# Patient Record
Sex: Male | Born: 1970
Health system: Southern US, Community
[De-identification: ages and names within clinical notes are randomized; demographics above are authoritative.]

## PROBLEM LIST (undated history)

## (undated) DIAGNOSIS — E1169 Type 2 diabetes mellitus with other specified complication: Secondary | ICD-10-CM

## (undated) DIAGNOSIS — J45909 Unspecified asthma, uncomplicated: Secondary | ICD-10-CM

## (undated) DIAGNOSIS — E119 Type 2 diabetes mellitus without complications: Secondary | ICD-10-CM

## (undated) DIAGNOSIS — K219 Gastro-esophageal reflux disease without esophagitis: Secondary | ICD-10-CM

## (undated) HISTORY — PX: APPENDECTOMY: SHX54

## (undated) HISTORY — DX: Type 2 diabetes mellitus with other specified complication: E11.69

## (undated) HISTORY — DX: Type 2 diabetes mellitus without complications: E11.9

## (undated) HISTORY — DX: Unspecified asthma, uncomplicated: J45.909

## (undated) HISTORY — PX: FRACTURE SURGERY: SHX138

---

## 2001-03-09 ENCOUNTER — Emergency Department (HOSPITAL_COMMUNITY): Admission: EM | Admit: 2001-03-09 | Discharge: 2001-03-09 | Payer: Self-pay | Admitting: Emergency Medicine

## 2006-03-23 ENCOUNTER — Encounter: Admission: RE | Admit: 2006-03-23 | Discharge: 2006-03-23 | Payer: Self-pay | Admitting: Occupational Medicine

## 2007-04-17 ENCOUNTER — Encounter: Admission: RE | Admit: 2007-04-17 | Discharge: 2007-04-17 | Payer: Self-pay | Admitting: Internal Medicine

## 2014-12-11 ENCOUNTER — Ambulatory Visit: Payer: Self-pay | Admitting: Allergy and Immunology

## 2014-12-15 ENCOUNTER — Ambulatory Visit (INDEPENDENT_AMBULATORY_CARE_PROVIDER_SITE_OTHER): Payer: PRIVATE HEALTH INSURANCE | Admitting: Allergy and Immunology

## 2014-12-15 ENCOUNTER — Encounter: Payer: Self-pay | Admitting: Allergy and Immunology

## 2014-12-15 VITALS — BP 132/98 | HR 68 | Temp 98.0°F | Resp 14 | Ht 68.39 in | Wt 220.0 lb

## 2014-12-15 DIAGNOSIS — J4530 Mild persistent asthma, uncomplicated: Secondary | ICD-10-CM

## 2014-12-15 DIAGNOSIS — K219 Gastro-esophageal reflux disease without esophagitis: Secondary | ICD-10-CM | POA: Diagnosis not present

## 2014-12-15 MED ORDER — ALBUTEROL SULFATE (2.5 MG/3ML) 0.083% IN NEBU
2.5000 mg | INHALATION_SOLUTION | Freq: Once | RESPIRATORY_TRACT | Status: AC
  Administered 2014-12-15: 2.5 mg via RESPIRATORY_TRACT

## 2014-12-15 MED ORDER — MOMETASONE FUROATE 220 MCG/INH IN AEPB: INHALATION_SPRAY | RESPIRATORY_TRACT | Status: DC

## 2014-12-15 MED ORDER — RANITIDINE HCL 300 MG PO TABS: ORAL_TABLET | ORAL | Status: DC

## 2014-12-15 NOTE — Patient Instructions (Addendum)
  1. Allergen avoidance measures  2. Treat reflux:   A. Continue ranitidine 354m one time per day  B. Slowly taper off all caffeine and chocolate  3. Change Advair to Asmanex 220 two inhalations one time per day.  4. If needed, Proair HFA 2 puffs every 4-6 hours  5. Get flu vaccine  6. Return in 4 weeks.

## 2014-12-15 NOTE — Progress Notes (Signed)
[facility]    NEW PATIENT NOTE  Referring provider: No ref. provider found PCP: Rochel Brome, MD    Subjective:   Patient ID: Gregory Gonzalez is a 44 y.o. male with a chief complaint of Breathing Problem  and the following problems:  HPI Comments:  Gregory Gonzalez is a 44 year old male who has a three-month history of breathing problems. He has developed shortness of breath as a persistent problem. Initially this was accompanied by chest pain and tachycardia requiring him to get a emergency room evaluation for pulmonary embolus which was negative. He subtotally saw Dr. Julianne Rice car and had a stress test and a stress echo and 24 hour or 48 hour Holter test all of which was negative. He did have some tachycardia up to 160. He was started on Cardizem. Somewhere prior to the administration of cardiac enzyme she developed wheezing and laryngitis. He subsequently ended up in the urgent care center again and was treated with Advair and ranitidine and a short acting bronchodilator and was better within 3 days. He does have reflux up into his mid chest and sometimes at nighttime up into his throat. He drinks 1 cup of coffee per day and one color per day.   History reviewed. No pertinent past medical history.  History reviewed. No pertinent past surgical history.  No outpatient prescriptions prior to visit.   No facility-administered medications prior to visit.    No orders of the defined types were placed in this encounter.    Allergies  Allergen Reactions  . Codeine Other (See Comments)    Hallucinations    Review of Systems  Constitutional: Negative for fever, chills, weight loss and malaise/fatigue.  HENT: Negative for congestion, ear discharge, ear pain, hearing loss, nosebleeds, sore throat and tinnitus.   Eyes: Negative for blurred vision, pain, discharge and redness.  Respiratory: Positive for cough, shortness of breath and wheezing.  Negative for hemoptysis, sputum production and stridor.   Cardiovascular: Positive for chest pain. Negative for palpitations and leg swelling.  Gastrointestinal: Negative for heartburn, nausea, vomiting, abdominal pain and diarrhea.  Musculoskeletal: Negative for myalgias and joint pain.  Skin: Negative for itching and rash.  Neurological: Negative for dizziness and headaches.    Family History  Problem Relation Age of Onset  . Asthma Mother   . Stroke Father   . Coronary artery disease Father   . Coronary artery disease Maternal Grandfather   . COPD Paternal Grandfather     Social History   Social History  . Marital Status: Married    Spouse Name: N/A  . Number of Children: N/A  . Years of Education: N/A   Occupational History  . Not on file.   Social History Main Topics  . Smoking status: Never Smoker   . Smokeless tobacco: Never Used  . Alcohol Use: Yes     Comment: rarely  . Drug Use: Not on file  . Sexual Activity: Not on file   Other Topics Concern  . Not on file   Social History Narrative  . No narrative on file    Environmental and Social history  Dene lives in a house with a dry environment, a cat and dog located inside the household, no carpeting in the bedroom, sleeping on a soft mattress without any plastic for the bed or pillows, and no smokers located inside the household. He works as a see an a Technical brewer.   Objective:   Filed Vitals:  12/15/14 1355  BP: 132/98  Pulse: 68  Temp: 98 F (36.7 C)  Resp: 14   Height: 5' 8.39" (173.7 cm) Weight: 220 lb 0.3 oz (99.8 kg)  Physical Exam  Constitutional: He is well-developed, well-nourished, and in no distress. No distress.  HENT:  Head: Normocephalic. Head is without laceration, without right periorbital erythema and without left periorbital erythema. Hair is normal.  Right Ear: Tympanic membrane and external ear normal. No lacerations. No drainage or tenderness. No foreign bodies. Tympanic membrane  is not injected, not scarred, not perforated, not erythematous, not retracted and not bulging. No middle ear effusion.  Left Ear: Tympanic membrane and external ear normal. No lacerations. No drainage or tenderness. No foreign bodies. Tympanic membrane is not injected, not scarred, not perforated, not erythematous, not retracted and not bulging.  No middle ear effusion.  Nose: Nose normal. No mucosal edema, rhinorrhea, nose lacerations or septal deviation.  No foreign bodies.  Mouth/Throat: Uvula is midline and oropharynx is clear and moist. No oral lesions. No uvula swelling. No oropharyngeal exudate, posterior oropharyngeal edema, posterior oropharyngeal erythema or tonsillar abscesses.  Eyes: Conjunctivae are normal. Pupils are equal, round, and reactive to light. Right eye exhibits no discharge. Left eye exhibits no discharge. No scleral icterus.  Neck: No tracheal deviation present. No thyromegaly present.  Cardiovascular: Normal rate, regular rhythm and normal heart sounds.  Exam reveals no gallop and no friction rub.   No murmur heard. Pulmonary/Chest: Effort normal and breath sounds normal. No stridor. No respiratory distress. He has no wheezes. He has no rales. He exhibits no tenderness.  Abdominal: Soft. He exhibits no distension and no mass. There is no tenderness. There is no rebound and no guarding.  Musculoskeletal: He exhibits no edema or tenderness.  Lymphadenopathy:    He has no cervical adenopathy.  Neurological: He is alert. Gait normal.  Skin: No rash noted. He is not diaphoretic. No erythema. No pallor.  Psychiatric: Mood and affect normal.    Diagnostics:  Allergy skin tests were performed. He did not demonstrate any hypersensitivity against a screening panel of aeroallergens or foods  Spirometry was performed and demonstrated an FEV1 of 3.4 to @ 93 % of predicted. After the administration of nebulized albuterol his FEV1 rose 3%  The patient had an Asthma Control Test  with the following results: ACT Total Score: 24.     Assessment and Plan:    1. Mild persistent asthma, uncomplicated   2. Gastroesophageal reflux disease, esophagitis presence not specified      1. Allergen avoidance measures  2. Treat reflux:   A. Continue ranitidine 393m one time per day  B. Slowly taper off all caffeine and chocolate  3. Change Advair to Asmanex 220 two inhalations one time per day.  4. If needed, Proair HFA 2 puffs every 4-6 hours  5. Get flu vaccine  6. Return in 4 weeks.  I will assume that BZaydonhas reflux as one of the triggers giving rise to his asthma. We'll treat him with a combination of caffeine taper and ranitidine and renewed his Advair for just a form of monotherapy using Asmanex. If he does well we will attempt to taper down his Asmanex. I will regroup him in 4 weeks.    EAllena Katz MD [facility]

## 2015-01-29 ENCOUNTER — Encounter: Payer: Self-pay | Admitting: Allergy and Immunology

## 2015-01-29 ENCOUNTER — Ambulatory Visit (INDEPENDENT_AMBULATORY_CARE_PROVIDER_SITE_OTHER): Payer: PRIVATE HEALTH INSURANCE | Admitting: Allergy and Immunology

## 2015-01-29 DIAGNOSIS — J4530 Mild persistent asthma, uncomplicated: Secondary | ICD-10-CM | POA: Diagnosis not present

## 2015-01-29 DIAGNOSIS — K219 Gastro-esophageal reflux disease without esophagitis: Secondary | ICD-10-CM | POA: Insufficient documentation

## 2015-01-29 MED ORDER — BUDESONIDE 180 MCG/ACT IN AEPB: INHALATION_SPRAY | RESPIRATORY_TRACT | Status: DC

## 2015-01-29 NOTE — Patient Instructions (Addendum)
   1. Treat reflux:   A. Continue ranitidine 355m one time per day  B. Continue to taper off all caffeine and chocolate  2. Change Asmanex 220 to Pumicort 180 two inhalations one time per day.  3. If needed, Proair HFA 2 puffs every 4-6 hours  4. Return in 12 weeks.  5. Cardizem?

## 2015-01-29 NOTE — Progress Notes (Signed)
[facility]  Follow-up Note  Refering Provider: Rochel Brome, MD Primary Provider: Rochel Brome, MD  Subjective:   Gregory Gonzalez is a 44 y.o. male who returns to the [facility] in re-evaluation of the following:  HPI Comments:  [name] returns to this clinic on a December 2016 in reevaluation of his asthma and reflux-induced respiratory disease. She is doing very well and has no complaints at this point in time. He's completely resolved all of his respiratory tract symptoms and his reflux symptoms in his throat issue. He continues on ranitidine every day but because of an insurance issue and he had to discontinue his Asmanex and he is noticed that he is developed a little bit more problems with chest fullness and chest tightness when he's missed his Asmanex for 3 days. He has not had any tachycardia as far as he knows.   Outpatient Encounter Prescriptions as of 01/29/2015  Medication Sig  . diltiazem (CARDIZEM CD) 120 MG 24 hr capsule Take 120 mg by mouth daily.  Marland Kitchen PROAIR HFA 108 (90 BASE) MCG/ACT inhaler INHALE ONE TO TWO PUFFS BY MOUTH EVERY 4 HOURS AS NEEDED  . ranitidine (ZANTAC) 300 MG tablet Take one tablet once daily.  . [DISCONTINUED] diltiazem (CARDIZEM CD) 120 MG 24 hr capsule TAKE ONE CAPSULE BY MOUTH DAILY  . mometasone (ASMANEX 60 METERED DOSES) 220 MCG/INH inhaler Take 2 inhalations once daily to prevent cough or wheeze.  Rinse, gargle,and spit after use. (Patient not taking: Reported on 01/29/2015)  . ranitidine (ZANTAC) 75 MG tablet Take 75 mg by mouth 2 (two) times daily. Take one tablet every morning. Can take one tablet at night if needed.  . traZODone (DESYREL) 50 MG tablet Take 50 mg by mouth at bedtime.  . [DISCONTINUED] ADVAIR DISKUS 250-50 MCG/DOSE AEPB Inhale 1 puff into the lungs 2 (two) times daily.    No facility-administered encounter medications on file as of 01/29/2015.    No orders of the  defined types were placed in this encounter.    No past medical history on file.  No past surgical history on file.  Allergies  Allergen Reactions  . Codeine Other (See Comments) and Anaphylaxis    Hallucinations    Review of Systems  Constitutional: Negative.   HENT: Negative.   Eyes: Negative.   Respiratory: Negative.   Cardiovascular: Negative.   Gastrointestinal: Negative.   Musculoskeletal: Negative.   Skin: Negative.   Hematological: Negative.      Objective:   There were no vitals filed for this visit.        Physical Exam  Constitutional: He appears well-developed and well-nourished. No distress.  HENT:  Head: Normocephalic and atraumatic. Head is without right periorbital erythema and without left periorbital erythema.  Right Ear: Tympanic membrane, external ear and ear canal normal. No drainage or tenderness. No foreign bodies. Tympanic membrane is not injected, not scarred, not perforated, not erythematous, not retracted and not bulging. No middle ear effusion.  Left Ear: Tympanic membrane, external ear and ear canal normal. No drainage or tenderness. No foreign bodies. Tympanic membrane is not injected, not scarred, not perforated, not erythematous, not retracted and not bulging.  No middle ear effusion.  Nose: Nose normal. No mucosal edema, rhinorrhea, nose lacerations or sinus tenderness.  No foreign bodies.  Mouth/Throat: Oropharynx is clear and moist. No oropharyngeal exudate, posterior oropharyngeal edema, posterior oropharyngeal erythema or tonsillar abscesses.  Eyes: Lids are normal. Right  eye exhibits no chemosis, no discharge and no exudate. No foreign body present in the right eye. Left eye exhibits no chemosis, no discharge and no exudate. No foreign body present in the left eye. Right conjunctiva is not injected. Left conjunctiva is not injected.  Neck: Neck supple. No tracheal tenderness present. No tracheal deviation and no edema present. No thyroid  mass and no thyromegaly present.  Cardiovascular: Normal rate, regular rhythm, S1 normal and S2 normal.  Exam reveals no gallop.   No murmur heard. Pulmonary/Chest: No accessory muscle usage or stridor. No respiratory distress. He has no wheezes. He has no rhonchi. He has no rales.  Abdominal: Soft.  Lymphadenopathy:       Head (right side): No tonsillar adenopathy present.       Head (left side): No tonsillar adenopathy present.    He has no cervical adenopathy.  Neurological: He is alert.  Skin: No rash noted. He is not diaphoretic.  Psychiatric: He has a normal mood and affect. His behavior is normal.    Diagnostics:    Spirometry was performed and demonstrated an FEV1 of 3.47 at 83 % of predicted.  The patient had an Asthma Control Test with the following results:  .    Assessment and Plan:   1. Mild persistent asthma, uncomplicated   2. Gastroesophageal reflux disease, esophagitis presence not specified       1. Treat reflux:   A. Continue ranitidine 365m one time per day  B. Continue to taper off all caffeine and chocolate  2. Change Asmanex 220 to Pumicort 180 two inhalations one time per day.  3. If needed, Proair HFA 2 puffs every 4-6 hours  4. Return in 12 weeks.  5. Cardizem?   BArifhas done very well on his current medical therapy and we'll continue to have him use an inhaled steroid and treatment for reflux and have him return to this clinic possibly 12 weeks. She's questioning whether or not he needs Cartia zyme. Apparently he had a very thorough evaluation him a cardiac standpoint looking for any pathology which apparently was negative regarding his tachycardia. He is wondering if his problem all along was a condition with asthma and possibly reflux. I did inform him that he can stop his Cardizem and see what happens and if he redevelops tachycardia can always repeat start Cardizem.   EAllena Katz MD [facility]

## 2015-09-22 MED FILL — PANTOPRAZOLE SOD DR 40 MG T: 40 | 30 days supply | Qty: 30 | Fill #0

## 2015-10-08 DIAGNOSIS — Z0000 Encounter for general adult medical examination without abnormal findings: Secondary | ICD-10-CM | POA: Diagnosis not present

## 2015-10-08 DIAGNOSIS — Z6832 Body mass index (BMI) 32.0-32.9, adult: Secondary | ICD-10-CM | POA: Diagnosis not present

## 2015-10-08 DIAGNOSIS — R7301 Impaired fasting glucose: Secondary | ICD-10-CM | POA: Diagnosis not present

## 2015-10-08 MED FILL — raNITIdine HCL 300 MG TABS: 300 | 90 days supply | Qty: 90 | Fill #0

## 2015-10-08 MED FILL — traZODone HCL 50 MG TABS: 50 | 90 days supply | Qty: 90 | Fill #0

## 2015-10-19 MED FILL — PANTOPRAZOLE SOD DR 40 MG T: 40 | 30 days supply | Qty: 30 | Fill #1

## 2015-11-20 MED FILL — PANTOPRAZOLE SOD DR 40 MG T: 40 | 30 days supply | Qty: 30 | Fill #2

## 2015-12-25 MED FILL — PANTOPRAZOLE SOD DR 40 MG T: 40 | 30 days supply | Qty: 30 | Fill #3

## 2016-01-18 MED FILL — raNITIdine HCL 300 MG TABS: 300 | 90 days supply | Qty: 90 | Fill #1

## 2016-01-18 MED FILL — traZODone HCL 50 MG TABS: 50 | 90 days supply | Qty: 90 | Fill #1

## 2016-01-18 MED FILL — PANTOPRAZOLE SOD DR 40 MG T: 40 | 90 days supply | Qty: 90 | Fill #0

## 2016-01-22 MED FILL — FLUTICASONE PROP 0.05% CRM: 0.05 | 7 days supply | Qty: 30 | Fill #0

## 2016-04-18 MED FILL — traZODone HCL 50 MG TABS: 50 | 90 days supply | Qty: 90 | Fill #0

## 2016-04-18 MED FILL — raNITIdine HCL 300 MG TABS: 300 | 90 days supply | Qty: 90 | Fill #0

## 2016-04-18 MED FILL — PANTOPRAZOLE SOD DR 40 MG T: 40 | 90 days supply | Qty: 90 | Fill #1

## 2016-04-21 DIAGNOSIS — R799 Abnormal finding of blood chemistry, unspecified: Secondary | ICD-10-CM | POA: Diagnosis not present

## 2016-04-21 DIAGNOSIS — F5101 Primary insomnia: Secondary | ICD-10-CM | POA: Diagnosis not present

## 2016-04-21 DIAGNOSIS — J4520 Mild intermittent asthma, uncomplicated: Secondary | ICD-10-CM | POA: Diagnosis not present

## 2016-04-21 DIAGNOSIS — K219 Gastro-esophageal reflux disease without esophagitis: Secondary | ICD-10-CM | POA: Diagnosis not present

## 2016-06-08 MED FILL — SYMBICORT 160-4.5 MCG INH: 160-4.5 | 30 days supply | Qty: 10 | Fill #0

## 2016-07-11 MED FILL — BYSTOLIC 5 MG TABLET: 5 | 90 days supply | Qty: 90 | Fill #0

## 2016-07-19 MED FILL — raNITIdine HCL 300 MG TABS: 300 | 90 days supply | Qty: 90 | Fill #0

## 2016-07-19 MED FILL — traZODone HCL 50 MG TABS: 50 | 90 days supply | Qty: 90 | Fill #0

## 2016-07-19 MED FILL — PANTOPRAZOLE SOD DR 40 MG T: 40 | 90 days supply | Qty: 90 | Fill #0

## 2016-08-03 MED FILL — SYMBICORT 160-4.5 MCG INH: 160-4.5 | 30 days supply | Qty: 10 | Fill #1

## 2016-10-07 MED FILL — PANTOPRAZOLE SOD DR 40 MG T: 40 | 90 days supply | Qty: 90 | Fill #1

## 2016-10-07 MED FILL — raNITIdine HCL 300 MG TABS: 300 | 90 days supply | Qty: 90 | Fill #1

## 2016-10-07 MED FILL — BYSTOLIC 5 MG TABLET: 5 | 90 days supply | Qty: 90 | Fill #1

## 2017-05-29 MED FILL — BYSTOLIC 5 MG TABLET: 5 | 90 days supply | Qty: 90 | Fill #2

## 2017-10-02 MED FILL — BYSTOLIC 5 MG TABLET: 5 | 90 days supply | Qty: 90 | Fill #0

## 2018-02-02 MED FILL — BYSTOLIC 5 MG TABLET: 5 | 90 days supply | Qty: 90 | Fill #1

## 2018-05-18 MED FILL — BYSTOLIC 5 MG TABLET: 5 | 90 days supply | Qty: 90 | Fill #0

## 2018-06-29 DIAGNOSIS — Z03818 Encounter for observation for suspected exposure to other biological agents ruled out: Secondary | ICD-10-CM | POA: Diagnosis not present

## 2018-07-06 ENCOUNTER — Ambulatory Visit (INDEPENDENT_AMBULATORY_CARE_PROVIDER_SITE_OTHER): Payer: 59 | Admitting: Family Medicine

## 2018-07-06 ENCOUNTER — Encounter: Payer: Self-pay | Admitting: Family Medicine

## 2018-07-06 ENCOUNTER — Other Ambulatory Visit: Payer: Self-pay

## 2018-07-06 VITALS — HR 55 | Ht 70.0 in | Wt 216.0 lb

## 2018-07-06 DIAGNOSIS — G4700 Insomnia, unspecified: Secondary | ICD-10-CM | POA: Insufficient documentation

## 2018-07-06 DIAGNOSIS — K219 Gastro-esophageal reflux disease without esophagitis: Secondary | ICD-10-CM

## 2018-07-06 DIAGNOSIS — T7840XA Allergy, unspecified, initial encounter: Secondary | ICD-10-CM | POA: Insufficient documentation

## 2018-07-06 DIAGNOSIS — R000 Tachycardia, unspecified: Secondary | ICD-10-CM | POA: Insufficient documentation

## 2018-07-06 MED ORDER — PANTOPRAZOLE SODIUM 40 MG PO TBEC: 40.0000 mg | DELAYED_RELEASE_TABLET | Freq: Every day | ORAL | 3 refills | Status: DC

## 2018-07-06 MED ORDER — LORATADINE 10 MG PO TABS: 10.0000 mg | ORAL_TABLET | Freq: Every day | ORAL | 3 refills | Status: DC

## 2018-07-06 MED ORDER — NEBIVOLOL HCL 5 MG PO TABS: 5.0000 mg | ORAL_TABLET | Freq: Every day | ORAL | 3 refills | Status: DC

## 2018-07-06 MED ORDER — TRAZODONE HCL 50 MG PO TABS: 50.0000 mg | ORAL_TABLET | Freq: Every day | ORAL | 3 refills | Status: DC

## 2018-07-06 MED FILL — PANTOPRAZOLE SOD DR 40 MG T: 40 | 90 days supply | Qty: 90 | Fill #0

## 2018-07-06 MED FILL — traZODone HCL 50 MG TABS: 50 | 90 days supply | Qty: 90 | Fill #0

## 2018-07-06 MED FILL — LORATADINE 10 MG TABLET: 10 | 100 days supply | Qty: 100 | Fill #0

## 2018-07-06 NOTE — Progress Notes (Signed)
Chief Complaint  Patient presents with  . New Patient (Initial Visit)    Establish care        New Patient Visit SUBJECTIVE: HPI: Gregory Gonzalez is an 48 y.o.male who is being seen for establishing care.  Due to COVID-19 pandemic, we are interacting via web portal for an electronic face-to-face visit. I verified patient's ID using 2 identifiers. Patient agreed to proceed with visit via this method. Patient is at home, I am at office. Patient and I are present for visit.   Hx of reflux that does poorly when he passes a certain weight. He is passed that weight currently due to pandemic. Would like refill of Protonix with goal of coming off once he loses the weight.  Trazodone helps with sleep. No AE's, compliant.   Bystolic for heart rate. Has undergone extensive cardiac workup for this. Nothing sinister was found.  Claritin helps with his seasonal allergies.   Allergies  Allergen Reactions  . Codeine Other (See Comments) and Anaphylaxis    Hallucinations    Past Medical History:  Diagnosis Date  . Asthma    History reviewed. No pertinent surgical history.   Family History  Problem Relation Age of Onset  . Stroke Father   . Coronary artery disease Father   . Asthma Mother   . Coronary artery disease Maternal Grandfather   . COPD Paternal Grandfather    Allergies  Allergen Reactions  . Codeine Other (See Comments) and Anaphylaxis    Hallucinations    Current Outpatient Medications:  .  loratadine (CLARITIN) 10 MG tablet, Take 1 tablet (10 mg total) by mouth daily., Disp: 90 tablet, Rfl: 3 .  nebivolol (BYSTOLIC) 5 MG tablet, Take 1 tablet (5 mg total) by mouth daily., Disp: 90 tablet, Rfl: 3 .  pantoprazole (PROTONIX) 40 MG tablet, Take 1 tablet (40 mg total) by mouth daily., Disp: 90 tablet, Rfl: 3 .  traZODone (DESYREL) 50 MG tablet, Take 1 tablet (50 mg total) by mouth at bedtime., Disp: 90 tablet, Rfl: 3   ROS Cardiovascular: Denies chest pain  Respiratory:  Denies dyspnea   OBJECTIVE: No conversational dyspnea Age appropriate judgment and insight Nml affect and mood  ASSESSMENT/PLAN: Gastroesophageal reflux disease, esophagitis presence not specified - Plan: pantoprazole (PROTONIX) 40 MG tablet  Allergic state, initial encounter - Plan: loratadine (CLARITIN) 10 MG tablet  Insomnia, unspecified type - Plan: traZODone (DESYREL) 50 MG tablet  Tachycardia - Plan: nebivolol (BYSTOLIC) 5 MG tablet  Stable on above. Refilled. Counseled on diet and exercise. Patient should return at earliest convenience for CPE. The patient voiced understanding and agreement to the plan.   [place], DO 07/06/18  10:43 AM

## 2018-08-28 ENCOUNTER — Encounter: Payer: 59 | Admitting: Family Medicine

## 2018-08-29 MED FILL — BYSTOLIC 5 MG TABLET: 5 | 90 days supply | Qty: 90 | Fill #0

## 2018-08-31 ENCOUNTER — Encounter: Payer: Self-pay | Admitting: Family Medicine

## 2018-10-02 MED FILL — traZODone HCL 50 MG TABS: 50 | 90 days supply | Qty: 90 | Fill #1

## 2018-10-02 MED FILL — PANTOPRAZOLE SOD DR 40 MG T: 40 | 90 days supply | Qty: 90 | Fill #1

## 2018-10-02 MED FILL — LORATADINE 10 MG TABLET: 10 | 100 days supply | Qty: 100 | Fill #1

## 2018-11-30 MED FILL — BYSTOLIC 5 MG TABLET: 5 | 90 days supply | Qty: 90 | Fill #0

## 2019-04-12 ENCOUNTER — Telehealth: Payer: Self-pay

## 2019-04-12 ENCOUNTER — Encounter: Payer: Self-pay | Admitting: Family Medicine

## 2019-04-12 MED ORDER — ATENOLOL 50 MG PO TABS: 50.0000 mg | ORAL_TABLET | Freq: Every day | ORAL | 3 refills | Status: DC

## 2019-04-12 MED FILL — ATENOLOL 50 MG TABLET: 50 | 30 days supply | Qty: 30 | Fill #0

## 2019-04-12 NOTE — Telephone Encounter (Signed)
Plz let pt know and see if he has failed any of these before. Ty.

## 2019-04-12 NOTE — Telephone Encounter (Signed)
Called the patient and he has not been on any of them The only thing he has tried was the cardizem

## 2019-04-12 NOTE — Telephone Encounter (Signed)
PA denied. Pt must have failed all of the following:  Atenolol Bisoprolol Metoprolol

## 2019-04-12 NOTE — Telephone Encounter (Signed)
Called informed of PCP instructions.

## 2019-04-12 NOTE — Telephone Encounter (Signed)
If for the heart rate, would rec atenolol, will send in. Ty.

## 2019-04-12 NOTE — Addendum Note (Signed)
Addended by: Sharon Seller B on: 04/12/2019 01:01 PM   Modules accepted: Orders

## 2019-04-12 NOTE — Telephone Encounter (Signed)
PA initiated via Covermymeds; KEY: [id number]. Awaiting determination.

## 2020-01-07 ENCOUNTER — Other Ambulatory Visit: Payer: Self-pay

## 2020-01-07 ENCOUNTER — Other Ambulatory Visit: Payer: Self-pay | Admitting: Family Medicine

## 2020-01-07 ENCOUNTER — Encounter: Payer: Self-pay | Admitting: Family Medicine

## 2020-01-07 ENCOUNTER — Ambulatory Visit (INDEPENDENT_AMBULATORY_CARE_PROVIDER_SITE_OTHER): Payer: 59 | Admitting: Family Medicine

## 2020-01-07 VITALS — BP 124/84 | HR 65 | Temp 98.5°F | Ht 70.0 in | Wt 215.0 lb

## 2020-01-07 DIAGNOSIS — G4700 Insomnia, unspecified: Secondary | ICD-10-CM | POA: Diagnosis not present

## 2020-01-07 DIAGNOSIS — J310 Chronic rhinitis: Secondary | ICD-10-CM | POA: Diagnosis not present

## 2020-01-07 DIAGNOSIS — R000 Tachycardia, unspecified: Secondary | ICD-10-CM | POA: Diagnosis not present

## 2020-01-07 DIAGNOSIS — J4540 Moderate persistent asthma, uncomplicated: Secondary | ICD-10-CM

## 2020-01-07 DIAGNOSIS — K219 Gastro-esophageal reflux disease without esophagitis: Secondary | ICD-10-CM

## 2020-01-07 MED ORDER — TRAZODONE HCL 50 MG PO TABS
50.0000 mg | ORAL_TABLET | Freq: Every day | ORAL | 3 refills | Status: DC
  Filled 2020-09-24: qty 90, 90d supply, fill #0
  Filled 2021-01-05: qty 90, 90d supply, fill #1

## 2020-01-07 MED ORDER — PANTOPRAZOLE SODIUM 40 MG PO TBEC: 40.0000 mg | DELAYED_RELEASE_TABLET | Freq: Every day | ORAL | 3 refills | Status: DC

## 2020-01-07 MED ORDER — FLUTICASONE PROPIONATE 50 MCG/ACT NA SUSP: 2.0000 | Freq: Every day | NASAL | 6 refills | Status: DC

## 2020-01-07 MED ORDER — ATENOLOL 50 MG PO TABS: 50.0000 mg | ORAL_TABLET | Freq: Every day | ORAL | 3 refills | Status: DC

## 2020-01-07 MED ORDER — BUDESONIDE-FORMOTEROL FUMARATE 160-4.5 MCG/ACT IN AERO: 2.0000 | INHALATION_SPRAY | Freq: Every day | RESPIRATORY_TRACT | 3 refills | Status: DC

## 2020-01-07 MED ORDER — LEVOCETIRIZINE DIHYDROCHLORIDE 5 MG PO TABS: 5.0000 mg | ORAL_TABLET | Freq: Every evening | ORAL | 2 refills | Status: DC

## 2020-01-07 MED FILL — FLUTICASONE PROP 50 MCG SPR: 50 | 30 days supply | Qty: 16 | Fill #0

## 2020-01-07 MED FILL — LEVOCETIRIZINE 5 MG TABLET: 5 | 30 days supply | Qty: 30 | Fill #0

## 2020-01-07 MED FILL — SYMBICORT 160-4.5 MCG INH: 160-4.5 | 60 days supply | Qty: 10 | Fill #0

## 2020-01-07 NOTE — Patient Instructions (Addendum)
Great seeing you.  Keep the diet clean and stay active.  The only lifestyle changes that have data behind them are weight loss for the overweight/obese and elevating the head of the bed. Finding out which foods/positions are triggers is important.  Let us know if you need anything.

## 2020-01-07 NOTE — Progress Notes (Signed)
Chief Complaint  Patient presents with   Follow-up    Subjective: Patient is a 49 y.o. male here for f/u.  Asthma Doing well w Symbicort nightly. It helps nightly. Rarely uses rescue inhaler. No current wheezing or coughing.   GERD Taking Protonix daily. No AE's. S/s's are well controlled.  Rhinitis Has runny nose and sneezing after eating. No food correlation. He does take Claritin daily. Has not tried anything else. No fevers.  Tachycardia +hx of tachycardia, was on Bystolic in past, but ins stopped covering. Changed to atenolol 50 mg/d. Compliant, no AE's.   Insomnia Hx of insomnia where he has trouble waking up. Takes Trazodone 50 mg qhs and does not have this issue. Compliant, no AE's.   Past Medical History:  Diagnosis Date   Asthma     Objective: BP 124/84 (BP Location: Left Arm, Patient Position: Sitting, Cuff Size: Normal)    Pulse 65    Temp 98.5 F (36.9 C) (Oral)    Ht 5' 10"  (1.778 m)    Wt 215 lb (97.5 kg)    SpO2 97%    BMI 30.85 kg/m  General: Awake, appears stated age HEENT: MMM, EOMi Heart: RRR, no murmurs Lungs: CTAB, no rales, wheezes or rhonchi. No accessory muscle use Psych: Age appropriate judgment and insight, normal affect and mood  Assessment and Plan: Moderate persistent asthma without complication - Plan: budesonide-formoterol (SYMBICORT) 160-4.5 MCG/ACT inhaler  Gastroesophageal reflux disease - Plan: pantoprazole (PROTONIX) 40 MG tablet  Insomnia, unspecified type - Plan: traZODone (DESYREL) 50 MG tablet  Gustatory rhinitis - Plan: fluticasone (FLONASE) 50 MCG/ACT nasal spray, levocetirizine (XYZAL) 5 MG tablet  Tachycardia - Plan: atenolol (TENORMIN) 50 MG tablet  1. Cont SABA and Symbicort as above.  2. Cont Protonix 40 mg/d. 3. Cont trazodone 50 mg/d.  4. Change Claritin to Xyzal. Start INCS.  5. Cont atenolol 50 mg/d.  F/u in 6 mo for CPE. The patient voiced understanding and agreement to the plan.  [place],  DO 01/07/20  4:32 PM

## 2020-01-28 ENCOUNTER — Telehealth: Payer: 59 | Admitting: Emergency Medicine

## 2020-01-28 DIAGNOSIS — R21 Rash and other nonspecific skin eruption: Secondary | ICD-10-CM | POA: Diagnosis not present

## 2020-01-28 MED ORDER — VALACYCLOVIR HCL 1 G PO TABS: 1000.0000 mg | ORAL_TABLET | Freq: Three times a day (TID) | ORAL | 0 refills | Status: DC

## 2020-01-28 MED ORDER — GABAPENTIN 300 MG PO CAPS: 300.0000 mg | ORAL_CAPSULE | Freq: Two times a day (BID) | ORAL | 0 refills | Status: DC

## 2020-01-28 NOTE — Progress Notes (Signed)
E-visit for Shingles   We are sorry that you are not feeling well. Here is how we plan to help!  Based on what you shared with me it looks like you have shingles.  Shingles or herpes zoster, is a common infection of the nerves.  It is a painful rash caused by the herpes zoster virus.  This is the same virus that causes chickenpox.  After a person has chickenpox, the virus remains inactive in the nerve cells.  Years later, the virus can become active again and travel to the skin.  It typically will appear on one side of the face or body.  Burning or shooting pain, tingling, or itching are early signs of the infection.  Blisters typically scab over in 7 to 10 days and clear up within 2-4 weeks. Shingles is only contagious to people that have never had the chickenpox, the chickenpox vaccine, or anyone who has a compromised immune system.  You should avoid contact with these type of people until your blisters scab over.  I have prescribed Valacyclovir 1g three times daily for 7 days and also Gabapentin 337m twice daily as needed for pain   HOME CARE: . Apply ice packs (wrapped in a thin towel), cool compresses, or soak in cool bath to help reduce pain. . Use calamine lotion to calm itchy skin. . Avoid scratching the rash. . Avoid direct sunlight.  GET HELP RIGHT AWAY IF: . Symptoms that don't away after treatment. . A rash or blisters near your eye. . Increased drainage, fever, or rash after treatment. . Severe pain that doesn't go away.   MAKE SURE YOU    Understand these instructions.  Will watch your condition.  Will get help right away if you are not doing well or get worse.  Thank you for choosing an e-visit. Your e-visit answers were reviewed by a board certified advanced clinical practitioner to complete your personal care plan. Depending upon the condition, your plan could have included both over the counter or prescription medications.  Please review your pharmacy choice.  Make sure the pharmacy is open so you can pick up prescription now. If there is a problem, you may contact your provider through [employer]and have the prescription routed to another pharmacy.  Your safety is important to [place] If you have drug allergies check your prescription carefully.   For the next 24 hours you can use MyChart to ask questions about today's visit, request a non-urgent call back, or ask for a work or school excuse.  You will get an email in the next two days asking about your experience. I hope that your e-visit has been valuable and will speed your recovery  Approximately 5 minutes was used in reviewing the patient's chart, questionnaire, prescribing medications, and documentation.

## 2020-02-10 MED FILL — FLUTICASONE PROP 50 MCG SPR: 50 | 30 days supply | Qty: 16 | Fill #1

## 2020-02-10 MED FILL — LEVOCETIRIZINE 5 MG TABLET: 5 | 30 days supply | Qty: 30 | Fill #1

## 2020-03-10 MED FILL — LEVOCETIRIZINE 5 MG TABLET: 5 | 30 days supply | Qty: 30 | Fill #2

## 2020-03-10 MED FILL — FLUTICASONE PROP 50 MCG SPR: 50 | 30 days supply | Qty: 16 | Fill #2

## 2020-03-21 ENCOUNTER — Other Ambulatory Visit (HOSPITAL_COMMUNITY): Payer: Self-pay | Admitting: Family Medicine

## 2020-03-21 MED FILL — SYMBICORT 160-4.5 MCG INH: 160-4.5 | 60 days supply | Qty: 10 | Fill #0

## 2020-04-10 MED FILL — ATENOLOL 50 MG TABS: 50 | 90 days supply | Qty: 90 | Fill #0

## 2020-04-10 MED FILL — PANTOPRAZOLE SOD DR 40 MG T: 40 | 90 days supply | Qty: 90 | Fill #0

## 2020-04-16 ENCOUNTER — Other Ambulatory Visit: Payer: Self-pay | Admitting: Family Medicine

## 2020-04-16 DIAGNOSIS — J310 Chronic rhinitis: Secondary | ICD-10-CM

## 2020-04-16 MED FILL — LEVOCETIRIZINE 5 MG TABLET: 5 | 90 days supply | Qty: 90 | Fill #0

## 2020-05-27 ENCOUNTER — Other Ambulatory Visit (HOSPITAL_BASED_OUTPATIENT_CLINIC_OR_DEPARTMENT_OTHER): Payer: Self-pay

## 2020-05-27 MED FILL — Budesonide-Formoterol Fumarate Dihyd Aerosol 160-4.5 MCG/ACT: RESPIRATORY_TRACT | 30 days supply | Qty: 10.2 | Fill #0 | Status: AC

## 2020-06-18 ENCOUNTER — Other Ambulatory Visit (HOSPITAL_COMMUNITY): Payer: Self-pay

## 2020-07-06 ENCOUNTER — Other Ambulatory Visit (HOSPITAL_BASED_OUTPATIENT_CLINIC_OR_DEPARTMENT_OTHER): Payer: Self-pay

## 2020-07-06 MED FILL — Levocetirizine Dihydrochloride Tab 5 MG: ORAL | 90 days supply | Qty: 90 | Fill #0 | Status: AC

## 2020-07-06 MED FILL — Atenolol Tab 50 MG: ORAL | 90 days supply | Qty: 90 | Fill #0 | Status: AC

## 2020-07-06 MED FILL — Pantoprazole Sodium EC Tab 40 MG (Base Equiv): ORAL | 90 days supply | Qty: 90 | Fill #0 | Status: AC

## 2020-07-28 ENCOUNTER — Other Ambulatory Visit (HOSPITAL_BASED_OUTPATIENT_CLINIC_OR_DEPARTMENT_OTHER): Payer: Self-pay

## 2020-07-28 MED FILL — Budesonide-Formoterol Fumarate Dihyd Aerosol 160-4.5 MCG/ACT: RESPIRATORY_TRACT | 30 days supply | Qty: 10.2 | Fill #1 | Status: AC

## 2020-08-03 ENCOUNTER — Other Ambulatory Visit (HOSPITAL_BASED_OUTPATIENT_CLINIC_OR_DEPARTMENT_OTHER): Payer: Self-pay

## 2020-08-11 ENCOUNTER — Other Ambulatory Visit (HOSPITAL_BASED_OUTPATIENT_CLINIC_OR_DEPARTMENT_OTHER): Payer: Self-pay

## 2020-08-11 MED FILL — Fluticasone Propionate Nasal Susp 50 MCG/ACT: NASAL | 30 days supply | Qty: 16 | Fill #0 | Status: AC

## 2020-09-24 ENCOUNTER — Other Ambulatory Visit (HOSPITAL_BASED_OUTPATIENT_CLINIC_OR_DEPARTMENT_OTHER): Payer: Self-pay

## 2020-09-24 MED FILL — Levocetirizine Dihydrochloride Tab 5 MG: ORAL | 90 days supply | Qty: 90 | Fill #1 | Status: AC

## 2020-09-24 MED FILL — Atenolol Tab 50 MG: ORAL | 90 days supply | Qty: 90 | Fill #1 | Status: AC

## 2020-09-24 MED FILL — Pantoprazole Sodium EC Tab 40 MG (Base Equiv): ORAL | 90 days supply | Qty: 90 | Fill #1 | Status: AC

## 2020-10-20 ENCOUNTER — Telehealth: Payer: 59 | Admitting: Physician Assistant

## 2020-10-20 DIAGNOSIS — R21 Rash and other nonspecific skin eruption: Secondary | ICD-10-CM | POA: Diagnosis not present

## 2020-10-20 DIAGNOSIS — W57XXXA Bitten or stung by nonvenomous insect and other nonvenomous arthropods, initial encounter: Secondary | ICD-10-CM

## 2020-10-20 MED ORDER — DOXYCYCLINE HYCLATE 100 MG PO TABS
100.0000 mg | ORAL_TABLET | Freq: Two times a day (BID) | ORAL | 0 refills | Status: AC
  Filled 2020-10-20: qty 42, 21d supply, fill #0

## 2020-10-20 NOTE — Progress Notes (Signed)
I have spent 5 minutes in review of e-visit questionnaire, review and updating patient chart, medical decision making and response to patient.   Kevin Mario Cody Asiana Benninger, PA-C    

## 2020-10-20 NOTE — Progress Notes (Addendum)
E-Visit for Tick Bite  Thank you for describing your tick bite, Here is how we plan to help! Based on the information that you shared with me it looks like you have A tick that bite that we will treat with a short course of doxycycline.  In most cases a tick bite is painless and does not itch.  Most tick bites in which the tick is quickly removed do not require prescriptions. Ticks can transmit several diseases if they are infected and remain attacked to your skin. Therefore the length that the tick was attached and any symptoms you have experienced after the bite are import to accurately develop your custom treatment plan. In most cases a single dose of doxycycline may prevent the development of a more serious condition.  Based on your information I have Provided a home care guide for tick bites and  instructions on when to call for help. and Your symptoms indicate that you need a longer course of antibiotics and a follow up visit with a provider. I have sent doxycycline 100 mg twice a day for 21 days to the pharmacy that you selected. You will need to schedule a follow up visit with your provider. If you do not have a primary care provider you may use our telehealth physicians on the web at [place]  are associated with illness?  The Wood Tick (dog tick) is the size of a watermelon seed and can sometimes transmit [facility] spotted fever and [place] tick fever.   The Deer Tick (black-legged tick) is between the size of a poppy seed (pin head) and an apple seed, and can sometimes transmit Lyme disease.  A brown to black tick with a white splotch on its back is likely a male Amblyomma americanum ([place] tick). This tick has been associated with [place] Tick Associated illness ( STARI)  Lyme disease has become the most common tick-borne illness in the [place]. The risk of Lyme disease following a recognized deer tick bite is estimated to be 1%.  The majority  of cases of Lyme disease start with a bull's eye rash at the site of the tick bite. The rash can occur days to weeks (typically 7-10 days) after a tick bite. Treatment with antibiotics is indicated if this rash appears. Flu-like symptoms may accompany the rash, including: fever, chills, headaches, muscle aches, and fatigue. Removing ticks promptly may prevent tick borne disease.  What can be used to prevent Tick Bites?  Insect repellant with at leas 20% DEET. Wearing long pants with sock and shoes. Avoiding tall grass and heavily wooded areas. Checking your skin after being outdoors. Shower with a washcloth after outdoor exposures.  HOME CARE ADVICE FOR TICK BITE  Wood Tick Removal:  Use a pair of tweezers and grasp the wood tick close to the skin (on its head). Pull the wood tick straight upward without twisting or crushing it. Maintain a steady pressure until it releases its grip.   If tweezers aren't available, use fingers, a loop of thread around the jaws, or a needle between the jaws for traction.  Note: covering the tick with petroleum jelly, nail polish or rubbing alcohol doesn't work. Neither does touching the tick with a hot or cold object. Tiny Deer Tick Removal:   Needs to be scraped off with a knife blade or credit card edge. Place tick in a sealed container (e.g. glass jar, zip lock plastic bag), in case your doctor wants to see it.  Tick's Head Removal:  If the wood tick's head breaks off in the skin, it must be removed. Clean the skin. Then use a sterile needle to uncover the head and lift it out or scrape it off.  If a very small piece of the head remains, the skin will eventually slough it off. Antibiotic Ointment:  Wash the wound and your hands with soap and water after removal to prevent catching any tick disease.  Apply an over the counter antibiotic ointment (e.g. bacitracin) to the bite once. Expected Course: Tick bites normally don't itch or hurt. That's why they often  go unnoticed. Call Your Doctor If:  You can't remove the tick or the tick's head Fever, a severe head ache, or rash occur in the next 2 weeks Bite begins to look infected Lyme's disease is common in your area You have not had a tetanus in the last 10 years Your current symptoms become worse    MAKE SURE YOU  Understand these instructions. Will watch your condition. Will get help right away if you are not doing well or get worse.    Thank you for choosing an e-visit.  Your e-visit answers were reviewed by a board certified advanced clinical practitioner to complete your personal care plan. Depending upon the condition, your plan could have included both over the counter or prescription medications.  Please review your pharmacy choice. Make sure the pharmacy is open so you can pick up prescription now. If there is a problem, you may contact your provider through [employer] and have the prescription routed to another pharmacy.  Your safety is important to [place]. If you have drug allergies check your prescription carefully.   For the next 24 hours you can use MyChart to ask questions about today's visit, request a non-urgent call back, or ask for a work or school excuse. You will get an email in the next two days asking about your experience. I hope that your e-visit has been valuable and will speed your recovery.

## 2020-10-21 ENCOUNTER — Other Ambulatory Visit (HOSPITAL_BASED_OUTPATIENT_CLINIC_OR_DEPARTMENT_OTHER): Payer: Self-pay

## 2020-11-17 ENCOUNTER — Encounter: Payer: Self-pay | Admitting: Family Medicine

## 2020-11-17 ENCOUNTER — Ambulatory Visit (INDEPENDENT_AMBULATORY_CARE_PROVIDER_SITE_OTHER): Payer: 59 | Admitting: Family Medicine

## 2020-11-17 ENCOUNTER — Other Ambulatory Visit: Payer: Self-pay

## 2020-11-17 VITALS — BP 124/78 | HR 60 | Temp 98.1°F | Ht 70.0 in | Wt 211.0 lb

## 2020-11-17 DIAGNOSIS — Z0000 Encounter for general adult medical examination without abnormal findings: Secondary | ICD-10-CM

## 2020-11-17 DIAGNOSIS — Z1211 Encounter for screening for malignant neoplasm of colon: Secondary | ICD-10-CM

## 2020-11-17 DIAGNOSIS — Z23 Encounter for immunization: Secondary | ICD-10-CM

## 2020-11-17 DIAGNOSIS — Z1159 Encounter for screening for other viral diseases: Secondary | ICD-10-CM | POA: Diagnosis not present

## 2020-11-17 DIAGNOSIS — Z114 Encounter for screening for human immunodeficiency virus [HIV]: Secondary | ICD-10-CM | POA: Diagnosis not present

## 2020-11-17 NOTE — Patient Instructions (Addendum)
Give [place] 2-3 business days to get the results of your labs back.   Keep the diet clean and stay active.  If you do not hear anything about your referral in the next 1-2 weeks, call our office and ask for an update.  Someone should send you a kit for the colon cancer screening in the next month or so. If you don't get anything, please send me a message.   Let us know if you need anything.

## 2020-11-17 NOTE — Progress Notes (Signed)
Chief Complaint  Patient presents with   Annual Exam    Well Male Gregory Gonzalez is here for a complete physical.   His last physical was >1 year ago.  Current diet: in general, a "healthy" diet.  Intermittent fasting.  Current exercise: walking Weight trend: steadily decreasing Fatigue out of ordinary? No. Seat belt? Yes.    Health maintenance Tetanus- No HIV- No Hep C- No  Past Medical History:  Diagnosis Date   Asthma      History reviewed. No pertinent surgical history.  Medications  Current Outpatient Medications on File Prior to Visit  Medication Sig Dispense Refill   Ascorbic Acid (VITAMIN C) 1000 MG tablet Take 1,000 mg by mouth daily.     atenolol (TENORMIN) 50 MG tablet TAKE 1 TABLET BY MOUTH ONCE DAILY 90 tablet 3   budesonide-formoterol (SYMBICORT) 160-4.5 MCG/ACT inhaler INHALE 2 PUFFS INTO THE LUNGS DAILY. 30.6 g 3   Cholecalciferol 125 MCG (5000 UT) capsule Take 5,000 Units by mouth daily.     famotidine (PEPCID) 10 MG tablet Take 10 mg by mouth 2 (two) times daily.     fluticasone (FLONASE) 50 MCG/ACT nasal spray PLACE 2 SPRAYS INTO BOTH NOSTRILS DAILY 16 g 6   levocetirizine (XYZAL) 5 MG tablet TAKE 1 TABLET BY MOUTH EVERY EVENING 90 tablet 3   pantoprazole (PROTONIX) 40 MG tablet TAKE 1 TABLET BY MOUTH ONCE DAILY 90 tablet 3   traZODone (DESYREL) 50 MG tablet Take 1 tablet (50 mg total) by mouth at bedtime. 90 tablet 3   Zinc 50 MG CAPS Take 1 capsule by mouth daily.      Allergies Allergies  Allergen Reactions   Codeine Other (See Comments) and Anaphylaxis    Hallucinations    Family History Family History  Problem Relation Age of Onset   Stroke Father    Coronary artery disease Father    Asthma Mother    Coronary artery disease Maternal Grandfather    COPD Paternal Grandfather     Review of Systems: Constitutional: no fevers or chills Eye:  no recent significant change in vision Ear/Nose/Mouth/Throat:  Ears:  no hearing  loss Nose/Mouth/Throat:  no complaints of nasal congestion, no sore throat Cardiovascular:  no chest pain Respiratory:  no shortness of breath Gastrointestinal:  no abdominal pain, no change in bowel habits GU:  Male: negative for dysuria, frequency, and incontinence Musculoskeletal/Extremities:  no pain of the joints Integumentary (Skin/Breast):  no abnormal skin lesions reported Neurologic:  no headaches Endocrine: No unexpected weight changes Hematologic/Lymphatic:  no night sweats  Exam BP 124/78   Pulse 60   Temp 98.1 F (36.7 C) (Oral)   Ht 5' 10"  (1.778 m)   Wt 211 lb (95.7 kg)   SpO2 97%   BMI 30.28 kg/m  General:  well developed, well nourished, in no apparent distress Skin:  no significant moles, warts, or growths Head:  no masses, lesions, or tenderness Eyes:  pupils equal and round, sclera anicteric without injection Ears:  canals without lesions, TMs shiny without retraction, no obvious effusion, no erythema Nose:  nares patent, septum midline, mucosa normal Throat/Pharynx:  lips and gingiva without lesion; tongue and uvula midline; non-inflamed pharynx; no exudates or postnasal drainage Neck: neck supple without adenopathy, thyromegaly, or masses Lungs:  clear to auscultation, breath sounds equal bilaterally, no respiratory distress Cardio:  regular rate and rhythm, no bruits, no LE edema Abdomen:  abdomen soft, nontender; bowel sounds normal; no masses or organomegaly Rectal: Deferred  Musculoskeletal:  symmetrical muscle groups noted without atrophy or deformity Extremities:  no clubbing, cyanosis, or edema, no deformities, no skin discoloration Neuro:  gait normal; deep tendon reflexes normal and symmetric Psych: well oriented with normal range of affect and appropriate judgment/insight  Assessment and Plan  Well adult exam - Plan: CBC, Comprehensive metabolic panel, Lipid panel, Hemoglobin A1c  Screening for colon cancer  Screening for HIV without  presence of risk factors - Plan: HIV Antibody (routine testing w rflx)  Encounter for hepatitis C screening test for low risk patient - Plan: Hepatitis C antibody   Well 50 y.o. male. Counseled on diet and exercise. Counseled on risks and benefits of prostate cancer screening with PSA. The patient agrees to forego screening.  Declines flu shot and colonoscopy. Will set up Cologard. Tdap today.  Other orders as above. Follow up in 6 mo pending the above workup. The patient voiced understanding and agreement to the plan.  [place], DO 11/17/20 2:50 PM

## 2020-11-17 NOTE — Addendum Note (Signed)
Addended by: Sharon Seller B on: 11/17/2020 03:04 PM   Modules accepted: Orders

## 2020-11-17 NOTE — Addendum Note (Signed)
Addended by: Manuela Schwartz on: 11/17/2020 03:01 PM   Modules accepted: Orders

## 2020-11-18 ENCOUNTER — Other Ambulatory Visit: Payer: Self-pay | Admitting: Family Medicine

## 2020-11-18 ENCOUNTER — Encounter: Payer: Self-pay | Admitting: Family Medicine

## 2020-11-18 ENCOUNTER — Ambulatory Visit (HOSPITAL_BASED_OUTPATIENT_CLINIC_OR_DEPARTMENT_OTHER)
Admission: RE | Admit: 2020-11-18 | Discharge: 2020-11-18 | Disposition: A | Discharge status: Home / Self Care | Payer: 59 | Source: Ambulatory Visit | Attending: Family Medicine | Admitting: Family Medicine

## 2020-11-18 ENCOUNTER — Other Ambulatory Visit (HOSPITAL_BASED_OUTPATIENT_CLINIC_OR_DEPARTMENT_OTHER): Payer: Self-pay

## 2020-11-18 DIAGNOSIS — E1169 Type 2 diabetes mellitus with other specified complication: Secondary | ICD-10-CM | POA: Insufficient documentation

## 2020-11-18 DIAGNOSIS — R7989 Other specified abnormal findings of blood chemistry: Secondary | ICD-10-CM

## 2020-11-18 DIAGNOSIS — E669 Obesity, unspecified: Secondary | ICD-10-CM | POA: Insufficient documentation

## 2020-11-18 DIAGNOSIS — K760 Fatty (change of) liver, not elsewhere classified: Secondary | ICD-10-CM | POA: Diagnosis not present

## 2020-11-18 LAB — COMPREHENSIVE METABOLIC PANEL
ALT: 58 IU/L — ABNORMAL HIGH (ref 0–44)
AST: 44 IU/L — ABNORMAL HIGH (ref 0–40)
Albumin/Globulin Ratio: 1.7 (ref 1.2–2.2)
Albumin: 4.7 g/dL (ref 4.0–5.0)
Alkaline Phosphatase: 79 IU/L (ref 44–121)
BUN/Creatinine Ratio: 15 (ref 9–20)
BUN: 12 mg/dL (ref 6–24)
Bilirubin Total: 0.5 mg/dL (ref 0.0–1.2)
CO2: 24 mmol/L (ref 20–29)
Calcium: 9.3 mg/dL (ref 8.7–10.2)
Chloride: 103 mmol/L (ref 96–106)
Creatinine, Ser: 0.78 mg/dL (ref 0.76–1.27)
Globulin, Total: 2.8 g/dL (ref 1.5–4.5)
Glucose: 105 mg/dL — ABNORMAL HIGH (ref 70–99)
Potassium: 4.6 mmol/L (ref 3.5–5.2)
Sodium: 140 mmol/L (ref 134–144)
Total Protein: 7.5 g/dL (ref 6.0–8.5)
eGFR: 110 mL/min/{1.73_m2} (ref >59)

## 2020-11-18 LAB — CBC
Hematocrit: 46.7 % (ref 37.5–51.0)
Hemoglobin: 15.5 g/dL (ref 13.0–17.7)
MCH: 27.4 pg (ref 26.6–33.0)
MCHC: 33.2 g/dL (ref 31.5–35.7)
MCV: 83 fL (ref 79–97)
Platelets: 236 10*3/uL (ref 150–450)
RBC: 5.66 x10E6/uL (ref 4.14–5.80)
RDW: 12.7 % (ref 11.6–15.4)
WBC: 10.4 10*3/uL (ref 3.4–10.8)

## 2020-11-18 LAB — LIPID PANEL
Chol/HDL Ratio: 3.4 ratio (ref 0.0–5.0)
Cholesterol, Total: 215 mg/dL — ABNORMAL HIGH (ref 100–199)
HDL: 63 mg/dL (ref >39)
LDL Chol Calc (NIH): 128 mg/dL — ABNORMAL HIGH (ref 0–99)
Triglycerides: 134 mg/dL (ref 0–149)
VLDL Cholesterol Cal: 24 mg/dL (ref 5–40)

## 2020-11-18 LAB — HEPATITIS C ANTIBODY: Hep C Virus Ab: <0.1 s/co ratio (ref 0.0–0.9)

## 2020-11-18 LAB — HEMOGLOBIN A1C
Est. average glucose Bld gHb Est-mCnc: 146 mg/dL
Hgb A1c MFr Bld: 6.7 % — ABNORMAL HIGH (ref 4.8–5.6)

## 2020-11-18 LAB — HIV ANTIBODY (ROUTINE TESTING W REFLEX): HIV Screen 4th Generation wRfx: NONREACTIVE

## 2020-11-18 MED ORDER — ROSUVASTATIN CALCIUM 20 MG PO TABS
20.0000 mg | ORAL_TABLET | Freq: Every day | ORAL | 3 refills | Status: DC
  Filled 2020-11-18: qty 90, 90d supply, fill #0
  Filled 2021-02-16: qty 90, 90d supply, fill #1
  Filled 2021-05-11: qty 90, 90d supply, fill #2
  Filled 2021-08-10: qty 90, 90d supply, fill #3

## 2020-11-25 ENCOUNTER — Other Ambulatory Visit (HOSPITAL_BASED_OUTPATIENT_CLINIC_OR_DEPARTMENT_OTHER): Payer: Self-pay

## 2020-11-25 MED ORDER — FREESTYLE LITE W/DEVICE KIT
1.0000 | PACK | Freq: Once | 0 refills | Status: AC
  Filled 2020-11-25: qty 1, 30d supply, fill #0

## 2020-11-25 MED ORDER — FREESTYLE LITE TEST VI STRP
ORAL_STRIP | 12 refills | Status: DC
  Filled 2020-11-25: qty 100, 90d supply, fill #0

## 2020-11-25 MED ORDER — FREESTYLE LANCETS MISC
12 refills | Status: DC
  Filled 2020-11-25: qty 100, 90d supply, fill #0

## 2020-12-02 ENCOUNTER — Other Ambulatory Visit (HOSPITAL_BASED_OUTPATIENT_CLINIC_OR_DEPARTMENT_OTHER): Payer: Self-pay

## 2020-12-02 DIAGNOSIS — Z1211 Encounter for screening for malignant neoplasm of colon: Secondary | ICD-10-CM | POA: Diagnosis not present

## 2020-12-02 MED FILL — Budesonide-Formoterol Fumarate Dihyd Aerosol 160-4.5 MCG/ACT: RESPIRATORY_TRACT | 30 days supply | Qty: 10.2 | Fill #0 | Status: AC

## 2020-12-13 LAB — COLOGUARD: Cologuard: NEGATIVE

## 2020-12-18 ENCOUNTER — Other Ambulatory Visit: Payer: Self-pay

## 2020-12-18 DIAGNOSIS — Z1211 Encounter for screening for malignant neoplasm of colon: Secondary | ICD-10-CM

## 2020-12-18 NOTE — Addendum Note (Signed)
Addended by: Gerilyn Nestle on: 12/18/2020 08:33 AM   Modules accepted: Orders

## 2020-12-21 ENCOUNTER — Encounter: Payer: Self-pay | Admitting: Family Medicine

## 2020-12-25 ENCOUNTER — Other Ambulatory Visit: Payer: 59

## 2020-12-29 ENCOUNTER — Other Ambulatory Visit: Payer: Self-pay

## 2020-12-29 ENCOUNTER — Other Ambulatory Visit (INDEPENDENT_AMBULATORY_CARE_PROVIDER_SITE_OTHER): Payer: 59

## 2020-12-29 DIAGNOSIS — R7989 Other specified abnormal findings of blood chemistry: Secondary | ICD-10-CM | POA: Diagnosis not present

## 2020-12-29 NOTE — Addendum Note (Signed)
Addended by: Kelle Darting A on: 12/29/2020 08:11 AM   Modules accepted: Orders

## 2020-12-30 LAB — LIPID PANEL
Chol/HDL Ratio: 2.3 ratio (ref 0.0–5.0)
Cholesterol, Total: 103 mg/dL (ref 100–199)
HDL: 44 mg/dL (ref >39)
LDL Chol Calc (NIH): 43 mg/dL (ref 0–99)
Triglycerides: 80 mg/dL (ref 0–149)
VLDL Cholesterol Cal: 16 mg/dL (ref 5–40)

## 2020-12-30 LAB — HEPATIC FUNCTION PANEL
ALT: 28 IU/L (ref 0–44)
AST: 24 IU/L (ref 0–40)
Albumin: 4.4 g/dL (ref 4.0–5.0)
Alkaline Phosphatase: 66 IU/L (ref 44–121)
Bilirubin Total: 0.5 mg/dL (ref 0.0–1.2)
Bilirubin, Direct: 0.17 mg/dL (ref 0.00–0.40)
Total Protein: 6.7 g/dL (ref 6.0–8.5)

## 2020-12-30 LAB — MICROALBUMIN / CREATININE URINE RATIO
Creatinine, Urine: 284.2 mg/dL
Microalb/Creat Ratio: 3 mg/g creat (ref 0–29)
Microalbumin, Urine: 9.9 ug/mL

## 2021-01-05 ENCOUNTER — Other Ambulatory Visit (HOSPITAL_BASED_OUTPATIENT_CLINIC_OR_DEPARTMENT_OTHER): Payer: Self-pay

## 2021-01-05 MED FILL — Atenolol Tab 50 MG: ORAL | 90 days supply | Qty: 90 | Fill #2 | Status: AC

## 2021-01-05 MED FILL — Pantoprazole Sodium EC Tab 40 MG (Base Equiv): ORAL | 90 days supply | Qty: 90 | Fill #2 | Status: AC

## 2021-01-05 MED FILL — Levocetirizine Dihydrochloride Tab 5 MG: ORAL | 90 days supply | Qty: 90 | Fill #2 | Status: AC

## 2021-01-05 MED FILL — Fluticasone Propionate Nasal Susp 50 MCG/ACT: NASAL | 30 days supply | Qty: 16 | Fill #1 | Status: AC

## 2021-02-16 ENCOUNTER — Other Ambulatory Visit (HOSPITAL_BASED_OUTPATIENT_CLINIC_OR_DEPARTMENT_OTHER): Payer: Self-pay

## 2021-03-16 ENCOUNTER — Other Ambulatory Visit: Payer: Self-pay | Admitting: Family Medicine

## 2021-03-16 ENCOUNTER — Other Ambulatory Visit (HOSPITAL_BASED_OUTPATIENT_CLINIC_OR_DEPARTMENT_OTHER): Payer: Self-pay

## 2021-03-16 MED ORDER — BUDESONIDE-FORMOTEROL FUMARATE 160-4.5 MCG/ACT IN AERO
2.0000 | INHALATION_SPRAY | Freq: Every day | RESPIRATORY_TRACT | 3 refills | Status: DC
  Filled 2021-03-16: qty 10.2, 30d supply, fill #0

## 2021-03-17 ENCOUNTER — Other Ambulatory Visit (HOSPITAL_BASED_OUTPATIENT_CLINIC_OR_DEPARTMENT_OTHER): Payer: Self-pay

## 2021-03-17 ENCOUNTER — Other Ambulatory Visit: Payer: Self-pay | Admitting: Family Medicine

## 2021-03-17 ENCOUNTER — Encounter: Payer: Self-pay | Admitting: Family Medicine

## 2021-03-17 MED ORDER — FLUTICASONE-SALMETEROL 250-50 MCG/ACT IN AEPB
1.0000 | INHALATION_SPRAY | Freq: Two times a day (BID) | RESPIRATORY_TRACT | 5 refills | Status: DC
  Filled 2021-03-17: qty 60, 30d supply, fill #0
  Filled 2021-04-08 – 2021-04-19 (×2): qty 60, 30d supply, fill #1
  Filled 2021-05-20: qty 60, 30d supply, fill #2
  Filled 2021-06-14: qty 60, 30d supply, fill #3
  Filled 2021-07-22: qty 60, 30d supply, fill #4
  Filled 2021-08-25: qty 60, 30d supply, fill #5

## 2021-04-08 ENCOUNTER — Other Ambulatory Visit: Payer: Self-pay | Admitting: Family Medicine

## 2021-04-08 ENCOUNTER — Other Ambulatory Visit (HOSPITAL_BASED_OUTPATIENT_CLINIC_OR_DEPARTMENT_OTHER): Payer: Self-pay

## 2021-04-08 DIAGNOSIS — J310 Chronic rhinitis: Secondary | ICD-10-CM

## 2021-04-08 DIAGNOSIS — R000 Tachycardia, unspecified: Secondary | ICD-10-CM

## 2021-04-08 DIAGNOSIS — G4700 Insomnia, unspecified: Secondary | ICD-10-CM

## 2021-04-08 MED ORDER — ATENOLOL 50 MG PO TABS
ORAL_TABLET | Freq: Every day | ORAL | 3 refills | Status: DC
Start: 2021-04-08 — End: 2022-02-11
  Filled 2021-04-08: qty 90, 90d supply, fill #0
  Filled 2021-07-04: qty 90, 90d supply, fill #1
  Filled 2021-09-12 – 2021-09-14 (×2): qty 90, 90d supply, fill #2
  Filled 2022-01-02: qty 90, 90d supply, fill #3

## 2021-04-08 MED ORDER — TRAZODONE HCL 50 MG PO TABS
50.0000 mg | ORAL_TABLET | Freq: Every day | ORAL | 3 refills | Status: DC
Start: 2021-04-08 — End: 2022-02-11
  Filled 2021-04-08: qty 90, 90d supply, fill #0
  Filled 2021-06-22: qty 90, 90d supply, fill #1
  Filled 2021-09-12: qty 90, 90d supply, fill #2
  Filled 2021-12-01 – 2021-12-06 (×2): qty 90, 90d supply, fill #3

## 2021-04-08 MED ORDER — LEVOCETIRIZINE DIHYDROCHLORIDE 5 MG PO TABS
ORAL_TABLET | Freq: Every evening | ORAL | 3 refills | Status: DC
  Filled 2021-04-08: qty 90, 90d supply, fill #0
  Filled 2021-07-04: qty 90, 90d supply, fill #1
  Filled 2021-09-12 – 2021-09-14 (×2): qty 90, 90d supply, fill #2
  Filled 2022-01-02: qty 90, 90d supply, fill #3

## 2021-04-19 ENCOUNTER — Other Ambulatory Visit (HOSPITAL_BASED_OUTPATIENT_CLINIC_OR_DEPARTMENT_OTHER): Payer: Self-pay

## 2021-05-11 ENCOUNTER — Other Ambulatory Visit (HOSPITAL_BASED_OUTPATIENT_CLINIC_OR_DEPARTMENT_OTHER): Payer: Self-pay

## 2021-05-11 ENCOUNTER — Other Ambulatory Visit: Payer: Self-pay | Admitting: Family Medicine

## 2021-05-11 ENCOUNTER — Encounter: Payer: Self-pay | Admitting: Family Medicine

## 2021-05-11 DIAGNOSIS — K219 Gastro-esophageal reflux disease without esophagitis: Secondary | ICD-10-CM

## 2021-05-11 MED ORDER — PANTOPRAZOLE SODIUM 40 MG PO TBEC
DELAYED_RELEASE_TABLET | Freq: Every day | ORAL | 0 refills | Status: DC
  Filled 2021-05-11: qty 30, 30d supply, fill #0

## 2021-05-11 NOTE — Telephone Encounter (Signed)
30 day supply sent. Letter sent to Pt via mychart to schedule appt. ?

## 2021-05-14 ENCOUNTER — Encounter: Payer: Self-pay | Admitting: Family Medicine

## 2021-05-14 ENCOUNTER — Ambulatory Visit: Payer: 59 | Admitting: Family Medicine

## 2021-05-14 ENCOUNTER — Other Ambulatory Visit (HOSPITAL_BASED_OUTPATIENT_CLINIC_OR_DEPARTMENT_OTHER): Payer: Self-pay

## 2021-05-14 VITALS — BP 120/82 | HR 60 | Temp 98.0°F | Resp 16 | Ht 70.0 in | Wt 184.0 lb

## 2021-05-14 DIAGNOSIS — E669 Obesity, unspecified: Secondary | ICD-10-CM

## 2021-05-14 DIAGNOSIS — K219 Gastro-esophageal reflux disease without esophagitis: Secondary | ICD-10-CM | POA: Diagnosis not present

## 2021-05-14 DIAGNOSIS — E1169 Type 2 diabetes mellitus with other specified complication: Secondary | ICD-10-CM | POA: Diagnosis not present

## 2021-05-14 MED ORDER — PANTOPRAZOLE SODIUM 40 MG PO TBEC
DELAYED_RELEASE_TABLET | Freq: Every day | ORAL | 2 refills | Status: DC
  Filled 2021-05-14: qty 90, fill #0
  Filled 2021-06-14: qty 90, 90d supply, fill #0
  Filled 2021-08-20 (×2): qty 90, 90d supply, fill #1
  Filled 2021-12-01: qty 90, 90d supply, fill #2

## 2021-05-14 NOTE — Patient Instructions (Addendum)
Give [place] 2-3 business days to get the results of your labs back.  ? ?Keep the diet clean and stay active. ? ?Schedule your eye exam at your convenience.  ? ?Let us know if you need anything. ?

## 2021-05-14 NOTE — Progress Notes (Signed)
Subjective:  ? ?Chief Complaint  ?Patient presents with  ? Follow-up  ?  Follow up  ? ? ?Gregory Gonzalez is a 51 y.o. male here for follow-up of diabetes.   [name] does not monitor his sugars.  ?Patient does not require insulin.   ?Medications include: diet controlled ?Diet is healthy.  ?Exercise: cardio, walking ?No Cp or SOB.  ? ?GERD ?Controlled on Protonix 40 mg/d, no AE's. Compliant. Pepcid prn. No N/V, bleeding, dysphagia, nighttime awakenings, bowel changes, unintentional wt loss.   ? ?Past Medical History:  ?Diagnosis Date  ? Asthma   ? Diabetes mellitus type 2 in obese Gregory Gonzalez)   ?  ? ?Related testing: ?Retinal exam: Due ?Pneumovax: done ? ?Objective:  ?BP 120/82 (BP Location: Right Arm, Patient Position: Sitting, Cuff Size: Normal)   Pulse 60   Temp 98 ?F (36.7 ?C) (Oral)   Resp 16   Ht 5' 10"  (1.778 m)   Wt 184 lb (83.5 kg)   SpO2 98%   BMI 26.40 kg/m?  ?General:  Well developed, well nourished, in no apparent distress ?Skin:  Warm, no pallor or diaphoresis ?Head:  Normocephalic, atraumatic ?Eyes:  Pupils equal and round, sclera anicteric without injection  ?Lungs:  CTAB, no access msc use ?Cardio:  RRR, no bruits, no LE edema ?Musculoskeletal:  Symmetrical muscle groups noted without atrophy or deformity ?Neuro:  Sensation intact to pinprick on feet ?Psych: Age appropriate judgment and insight ? ?Assessment:  ? ?Diabetes mellitus type 2 in obese (Gregory Gonzalez) - Plan: Hemoglobin A1c ? ?Gastroesophageal reflux disease - Plan: pantoprazole (PROTONIX) 40 MG tablet  ? ?Plan:  ? ?Chronic, stable. Cont diet control, cont statin. Counseled on diet and exercise. ?Chronic, stable. Cont Protonix 40 mg/d.  ?F/u in 6 mo. ?The patient voiced understanding and agreement to the plan. ? ?Shelda Pal, DO ?05/14/21 ?2:47 PM ? ? ? ? ? ? ?

## 2021-05-15 LAB — HEMOGLOBIN A1C
Est. average glucose Bld gHb Est-mCnc: 126 mg/dL
Hgb A1c MFr Bld: 6 % — ABNORMAL HIGH (ref 4.8–5.6)

## 2021-05-20 ENCOUNTER — Other Ambulatory Visit (HOSPITAL_BASED_OUTPATIENT_CLINIC_OR_DEPARTMENT_OTHER): Payer: Self-pay

## 2021-06-11 ENCOUNTER — Other Ambulatory Visit (HOSPITAL_BASED_OUTPATIENT_CLINIC_OR_DEPARTMENT_OTHER): Payer: Self-pay

## 2021-06-14 ENCOUNTER — Other Ambulatory Visit (HOSPITAL_BASED_OUTPATIENT_CLINIC_OR_DEPARTMENT_OTHER): Payer: Self-pay

## 2021-06-23 ENCOUNTER — Other Ambulatory Visit (HOSPITAL_BASED_OUTPATIENT_CLINIC_OR_DEPARTMENT_OTHER): Payer: Self-pay

## 2021-07-05 ENCOUNTER — Other Ambulatory Visit (HOSPITAL_BASED_OUTPATIENT_CLINIC_OR_DEPARTMENT_OTHER): Payer: Self-pay

## 2021-07-22 ENCOUNTER — Other Ambulatory Visit (HOSPITAL_BASED_OUTPATIENT_CLINIC_OR_DEPARTMENT_OTHER): Payer: Self-pay

## 2021-07-29 ENCOUNTER — Other Ambulatory Visit (HOSPITAL_BASED_OUTPATIENT_CLINIC_OR_DEPARTMENT_OTHER): Payer: Self-pay

## 2021-08-10 ENCOUNTER — Other Ambulatory Visit (HOSPITAL_BASED_OUTPATIENT_CLINIC_OR_DEPARTMENT_OTHER): Payer: Self-pay

## 2021-08-16 ENCOUNTER — Other Ambulatory Visit (HOSPITAL_BASED_OUTPATIENT_CLINIC_OR_DEPARTMENT_OTHER): Payer: Self-pay

## 2021-08-20 ENCOUNTER — Other Ambulatory Visit (HOSPITAL_BASED_OUTPATIENT_CLINIC_OR_DEPARTMENT_OTHER): Payer: Self-pay

## 2021-08-20 ENCOUNTER — Encounter (HOSPITAL_BASED_OUTPATIENT_CLINIC_OR_DEPARTMENT_OTHER): Payer: Self-pay

## 2021-08-25 ENCOUNTER — Other Ambulatory Visit (HOSPITAL_BASED_OUTPATIENT_CLINIC_OR_DEPARTMENT_OTHER): Payer: Self-pay

## 2021-09-12 ENCOUNTER — Other Ambulatory Visit: Payer: Self-pay | Admitting: Family Medicine

## 2021-09-12 DIAGNOSIS — J310 Chronic rhinitis: Secondary | ICD-10-CM

## 2021-09-13 ENCOUNTER — Other Ambulatory Visit (HOSPITAL_BASED_OUTPATIENT_CLINIC_OR_DEPARTMENT_OTHER): Payer: Self-pay

## 2021-09-13 MED ORDER — FLUTICASONE-SALMETEROL 250-50 MCG/ACT IN AEPB
1.0000 | INHALATION_SPRAY | Freq: Two times a day (BID) | RESPIRATORY_TRACT | 5 refills | Status: DC
  Filled 2021-09-13 – 2021-09-20 (×2): qty 60, 30d supply, fill #0
  Filled 2021-10-21: qty 60, 30d supply, fill #1
  Filled 2021-11-15: qty 60, 30d supply, fill #2
  Filled 2022-01-02: qty 60, 30d supply, fill #3
  Filled 2022-02-04: qty 60, 30d supply, fill #4
  Filled 2022-03-05: qty 60, 30d supply, fill #5

## 2021-09-13 MED ORDER — FLUTICASONE PROPIONATE 50 MCG/ACT NA SUSP
2.0000 | Freq: Every day | NASAL | 6 refills | Status: AC
  Filled 2021-09-13: qty 16, 30d supply, fill #0

## 2021-09-14 ENCOUNTER — Other Ambulatory Visit (HOSPITAL_BASED_OUTPATIENT_CLINIC_OR_DEPARTMENT_OTHER): Payer: Self-pay

## 2021-09-20 ENCOUNTER — Other Ambulatory Visit (HOSPITAL_BASED_OUTPATIENT_CLINIC_OR_DEPARTMENT_OTHER): Payer: Self-pay

## 2021-10-04 ENCOUNTER — Other Ambulatory Visit (HOSPITAL_BASED_OUTPATIENT_CLINIC_OR_DEPARTMENT_OTHER): Payer: Self-pay

## 2021-10-21 ENCOUNTER — Other Ambulatory Visit (HOSPITAL_BASED_OUTPATIENT_CLINIC_OR_DEPARTMENT_OTHER): Payer: Self-pay

## 2021-11-01 ENCOUNTER — Encounter: Payer: Self-pay | Admitting: Family Medicine

## 2021-11-01 ENCOUNTER — Ambulatory Visit (HOSPITAL_BASED_OUTPATIENT_CLINIC_OR_DEPARTMENT_OTHER)
Admission: RE | Admit: 2021-11-01 | Discharge: 2021-11-01 | Disposition: A | Discharge status: Home / Self Care | Payer: 59 | Source: Ambulatory Visit | Attending: Family Medicine | Admitting: Family Medicine

## 2021-11-01 ENCOUNTER — Other Ambulatory Visit: Payer: Self-pay | Admitting: Family Medicine

## 2021-11-01 DIAGNOSIS — M25552 Pain in left hip: Secondary | ICD-10-CM | POA: Diagnosis not present

## 2021-11-01 DIAGNOSIS — M160 Bilateral primary osteoarthritis of hip: Secondary | ICD-10-CM | POA: Diagnosis not present

## 2021-11-11 ENCOUNTER — Other Ambulatory Visit: Payer: Self-pay | Admitting: Family Medicine

## 2021-11-12 ENCOUNTER — Other Ambulatory Visit (HOSPITAL_BASED_OUTPATIENT_CLINIC_OR_DEPARTMENT_OTHER): Payer: Self-pay

## 2021-11-12 MED ORDER — ROSUVASTATIN CALCIUM 20 MG PO TABS
20.0000 mg | ORAL_TABLET | Freq: Every day | ORAL | 3 refills | Status: DC
  Filled 2021-11-12: qty 90, 90d supply, fill #0

## 2021-11-15 ENCOUNTER — Other Ambulatory Visit (HOSPITAL_BASED_OUTPATIENT_CLINIC_OR_DEPARTMENT_OTHER): Payer: Self-pay

## 2021-11-24 ENCOUNTER — Other Ambulatory Visit (HOSPITAL_BASED_OUTPATIENT_CLINIC_OR_DEPARTMENT_OTHER): Payer: Self-pay

## 2021-12-02 ENCOUNTER — Other Ambulatory Visit (HOSPITAL_BASED_OUTPATIENT_CLINIC_OR_DEPARTMENT_OTHER): Payer: Self-pay

## 2021-12-06 ENCOUNTER — Other Ambulatory Visit (HOSPITAL_BASED_OUTPATIENT_CLINIC_OR_DEPARTMENT_OTHER): Payer: Self-pay

## 2021-12-15 ENCOUNTER — Other Ambulatory Visit (HOSPITAL_BASED_OUTPATIENT_CLINIC_OR_DEPARTMENT_OTHER): Payer: Self-pay

## 2022-01-03 ENCOUNTER — Other Ambulatory Visit (HOSPITAL_BASED_OUTPATIENT_CLINIC_OR_DEPARTMENT_OTHER): Payer: Self-pay

## 2022-02-04 ENCOUNTER — Other Ambulatory Visit (HOSPITAL_BASED_OUTPATIENT_CLINIC_OR_DEPARTMENT_OTHER): Payer: Self-pay

## 2022-02-11 ENCOUNTER — Encounter: Payer: Self-pay | Admitting: Family Medicine

## 2022-02-11 ENCOUNTER — Other Ambulatory Visit (HOSPITAL_BASED_OUTPATIENT_CLINIC_OR_DEPARTMENT_OTHER): Payer: Self-pay

## 2022-02-11 ENCOUNTER — Ambulatory Visit: Payer: 59 | Admitting: Family Medicine

## 2022-02-11 VITALS — BP 138/80 | HR 62 | Temp 98.0°F | Ht 70.0 in | Wt 203.0 lb

## 2022-02-11 DIAGNOSIS — R000 Tachycardia, unspecified: Secondary | ICD-10-CM

## 2022-02-11 DIAGNOSIS — G4700 Insomnia, unspecified: Secondary | ICD-10-CM

## 2022-02-11 DIAGNOSIS — K219 Gastro-esophageal reflux disease without esophagitis: Secondary | ICD-10-CM | POA: Diagnosis not present

## 2022-02-11 DIAGNOSIS — E669 Obesity, unspecified: Secondary | ICD-10-CM | POA: Diagnosis not present

## 2022-02-11 DIAGNOSIS — Z125 Encounter for screening for malignant neoplasm of prostate: Secondary | ICD-10-CM

## 2022-02-11 DIAGNOSIS — E1169 Type 2 diabetes mellitus with other specified complication: Secondary | ICD-10-CM | POA: Diagnosis not present

## 2022-02-11 DIAGNOSIS — Z0000 Encounter for general adult medical examination without abnormal findings: Secondary | ICD-10-CM

## 2022-02-11 MED ORDER — ALBUTEROL SULFATE HFA 108 (90 BASE) MCG/ACT IN AERS
2.0000 | INHALATION_SPRAY | Freq: Four times a day (QID) | RESPIRATORY_TRACT | 2 refills | Status: AC | PRN
  Filled 2022-02-11: qty 6.7, 25d supply, fill #0

## 2022-02-11 MED ORDER — ATENOLOL 50 MG PO TABS
50.0000 mg | ORAL_TABLET | Freq: Every day | ORAL | 3 refills | Status: AC
  Filled 2022-02-11: qty 90, fill #0
  Filled 2022-03-05 – 2022-03-30 (×2): qty 90, 90d supply, fill #0
  Filled 2022-06-27: qty 30, 30d supply, fill #1
  Filled 2022-07-22 (×2): qty 30, 30d supply, fill #2
  Filled 2022-08-22: qty 30, 30d supply, fill #3
  Filled 2022-10-09: qty 90, 90d supply, fill #4

## 2022-02-11 MED ORDER — PANTOPRAZOLE SODIUM 40 MG PO TBEC
40.0000 mg | DELAYED_RELEASE_TABLET | Freq: Every day | ORAL | 2 refills | Status: AC
  Filled 2022-02-11 – 2022-03-05 (×2): qty 90, 90d supply, fill #0
  Filled 2022-06-07: qty 90, 90d supply, fill #1
  Filled 2022-08-22: qty 30, 30d supply, fill #2
  Filled 2022-10-09: qty 60, 60d supply, fill #3

## 2022-02-11 MED ORDER — TRAZODONE HCL 50 MG PO TABS
50.0000 mg | ORAL_TABLET | Freq: Every day | ORAL | 3 refills | Status: AC
Start: 2022-02-11 — End: ?
  Filled 2022-02-11 – 2022-03-05 (×2): qty 90, 90d supply, fill #0
  Filled 2022-06-12: qty 90, 90d supply, fill #1
  Filled 2022-08-22: qty 30, 30d supply, fill #2
  Filled 2022-10-09: qty 90, 90d supply, fill #3

## 2022-02-11 MED ORDER — ATORVASTATIN CALCIUM 10 MG PO TABS
10.0000 mg | ORAL_TABLET | Freq: Every day | ORAL | 3 refills | Status: AC
  Filled 2022-02-11: qty 90, 90d supply, fill #0
  Filled 2022-05-16: qty 90, 90d supply, fill #1
  Filled 2022-07-22 (×2): qty 30, 30d supply, fill #2
  Filled 2022-08-22: qty 30, 30d supply, fill #3
  Filled 2022-10-09: qty 90, 90d supply, fill #4

## 2022-02-11 NOTE — Patient Instructions (Addendum)
Give [place] 2-3 business days to get the results of your labs back.   Keep the diet clean and stay active.  Please get me a copy of your advanced directive form at your convenience.   The Shingrix vaccine (for shingles) is a 2 shot series spaced 2-6 months apart. It can make people feel low energy, achy and almost like they have the flu for 48 hours after injection. 1/5 people can have nausea and/or vomiting. Please plan accordingly when deciding on when to get this shot. Call our office for a nurse visit appointment to get this. The second shot of the series is less severe regarding the side effects, but it still lasts 48 hours.   Let us know if you need anything.  Hip Exercises It is normal to feel mild stretching, pulling, tightness, or discomfort as you do these exercises, but you should stop right away if you feel sudden pain or your pain gets worse.   STRETCHING AND RANGE OF MOTION EXERCISES These exercises warm up your muscles and joints and improve the movement and flexibility of your hip. These exercises also help to relieve pain, numbness, and tingling. Exercise A: Hamstrings, Supine  Lie on your back. Loop a belt or towel over the ball of your left / right foot. The ball of your foot is on the walking surface, right under your toes. Straighten your left / right knee and slowly pull on the belt to raise your leg. Do not let your left / right knee bend while you do this. Keep your other leg flat on the floor. Raise the left / right leg until you feel a gentle stretch behind your left / right knee or thigh. Hold this position for 30 seconds. Slowly return your leg to the starting position. Repeat2 times. Complete this stretch 3 times per week. Exercise B: Hip Rotators  Lie on your back on a firm surface. Hold your left / right knee with your left / right hand. Hold your ankle with your other hand. Gently pull your left / right knee and rotate your lower leg toward your other  shoulder. Pull until you feel a stretch in your buttocks. Keep your hips and shoulders firmly planted while you do this stretch. Hold this position for 30 seconds. Repeat 2 times. Complete this stretch 3 times per week. Exercise C: V-Sit (Hamstrings and Adductors)  Sit on the floor with your legs extended in a large "V" shape. Keep your knees straight during this exercise. Start with your head and chest upright, then bend at your waist to reach for your left foot (position A). You should feel a stretch in your right inner thigh. Hold this position for 30 seconds. Then slowly return to the upright position. Bend at your waist to reach forward (position B). You should feel a stretch behind both of your thighs and knees. Hold this position for 30 seconds. Then slowly return to the upright position. Bend at your waist to reach for your right foot (position C). You should feel a stretch in your left inner thigh. Hold this position for 30 seconds. Then slowly return to the upright position. Repeat A, B, and C 2 times each. Complete this stretch 3 times per week. Exercise D: Lunge (Hip Flexors)  Place your left / right knee on the floor and bend your other knee so that is directly over your ankle. You should be half-kneeling. Keep good posture with your head over your shoulders. Tighten your buttocks to  point your tailbone downward. This helps your back to keep from arching too much. You should feel a gentle stretch in the front of your left / right thigh and hip. If you do not feel any resistance, slightly slide your other foot forward and then slowly lunge forward so your knee once again lines up over your ankle. Make sure your tailbone continues to point downward. Hold this position for 30 seconds. Repeat 2 times. Complete this stretch 3 times per week.  STRENGTHENING EXERCISES These exercises build strength and endurance in your hip. Endurance is the ability to use your muscles for a long  time, even after they get tired. Exercise E: Bridge (Hip Extensors)  Lie on your back on a firm surface with your knees bent and your feet flat on the floor. Tighten your buttocks muscles and lift your bottom off the floor until the trunk of your body is level with your thighs. Do not arch your back. You should feel the muscles working in your buttocks and the back of your thighs. If you do not feel these muscles, slide your feet 1-2 inches (2.5-5 cm) farther away from your buttocks. Hold this position for 3 seconds. Slowly lower your hips to the starting position. Repeat for a total of 10 repetitions. Let your muscles relax completely between repetitions. If this exercise is too easy, try doing it with your arms crossed over your chest. Repeat 2 times. Complete this exercise 3 times per week. Exercise F: Straight Leg Raises - Hip Abductors  Lie on your side with your left / right leg in the top position. Lie so your head, shoulder, knee, and hip line up with each other. You may bend your bottom knee to help you balance. Roll your hips slightly forward, so your hips are stacked directly over each other and your left / right knee is facing forward. Leading with your heel, lift your top leg 4-6 inches (10-15 cm). You should feel the muscles in your outer hip lifting. Do not let your foot drift forward. Do not let your knee roll toward the ceiling. Hold this position for 1 second. Slowly return to the starting position. Let your muscles relax completely between repetitions. Repeat for a total of 10 repetitions.  Repeat 2 times. Complete this exercise 3 times per week. Exercise G: Straight Leg Raises - Hip Adductors  Lie on your side with your left / right leg in the bottom position. Lie so your head, shoulder, knee, and hip line up. You may place your upper foot in front to help you balance. Roll your hips slightly forward, so your hips are stacked directly over each other and your left /  right knee is facing forward. Tense the muscles in your inner thigh and lift your bottom leg 4-6 inches (10-15 cm). Hold this position for 1 second. Slowly return to the starting position. Let your muscles relax completely between repetitions. Repeat for a total of 10 repetitions. Repeat 2 times. Complete this exercise 3 times per week. Exercise H: Straight Leg Raises - Quadriceps  Lie on your back with your left / right leg extended and your other knee bent. Tense the muscles in the front of your left / right thigh. When you do this, you should see your kneecap slide up or see increased dimpling just above your knee. Tighten these muscles even more and raise your leg 4-6 inches (10-15 cm) off the floor. Hold this position for 3 seconds. Keep these muscles tense as  you lower your leg. Relax the muscles slowly and completely between repetitions. Repeat for a total of 10 repetitions. Repeat 2 times. Complete this exercise 3 times per week. Exercise I: Hip Abductors, Standing Tie one end of a rubber exercise band or tubing to a secure surface, such as a table or pole. Loop the other end of the band or tubing around your left / right ankle. Keeping your ankle with the band or tubing directly opposite of the secured end, step away until there is tension in the tubing or band. Hold onto a chair as needed for balance. Lift your left / right leg out to your side. While you do this: Keep your back upright. Keep your shoulders over your hips. Keep your toes pointing forward. Make sure to use your hip muscles to lift your leg. Do not "throw" your leg or tip your body to lift your leg. Hold this position for 1 second. Slowly return to the starting position. Repeat for a total of 10 repetitions. Repeat 2 times. Complete this exercise 3 times per week. Exercise J: Squats (Quadriceps) Stand in a door frame so your feet and knees are in line with the frame. You may place your hands on the frame for  balance. Slowly bend your knees and lower your hips like you are going to sit in a chair. Keep your lower legs in a straight-up-and-down position. Do not let your hips go lower than your knees. Do not bend your knees lower than told by your health care provider. If your hip pain increases, do not bend as low. Hold this position for 1 second. Slowly push with your legs to return to standing. Do not use your hands to pull yourself to standing. Repeat for a total of 10 repetitions. Repeat 2 times. Complete this exercise 3 times per week. Make sure you discuss any questions you have with your health care provider. Document Released: 02/25/2005 Document Revised: 11/02/2015 Document Reviewed: 02/02/2015 Elsevier Interactive Patient Education  [employer].

## 2022-02-11 NOTE — Progress Notes (Signed)
Chief Complaint  Patient presents with   Follow-up    Medication refills Needs a Refill of Albuterol Inhaler    Well Male Gregory Gonzalez is here for a complete physical.   His last physical was >1 year ago.  Current diet: in general, diet could be better.   Current exercise: none Weight trend: increased a little Fatigue out of ordinary? No. Seat belt? Yes.   Advanced directive? Yes  Health maintenance Tetanus- Yes HIV- Yes Hep C- Yes CCS- Yes  Past Medical History:  Diagnosis Date   Asthma    Diabetes mellitus type 2 in obese [facility])      History reviewed. No pertinent surgical history.  Medications  Current Outpatient Medications on File Prior to Visit  Medication Sig Dispense Refill   Ascorbic Acid (VITAMIN C) 1000 MG tablet Take 1,000 mg by mouth daily.     Cholecalciferol 125 MCG (5000 UT) capsule Take 5,000 Units by mouth daily.     famotidine (PEPCID) 10 MG tablet Take 10 mg by mouth 2 (two) times daily.     fluticasone (FLONASE) 50 MCG/ACT nasal spray PLACE 2 SPRAYS INTO BOTH NOSTRILS DAILY 16 g 6   fluticasone-salmeterol (ADVAIR DISKUS) 250-50 MCG/ACT AEPB Inhale 1 puff into the lungs in the morning and at bedtime. 60 each 5   levocetirizine (XYZAL) 5 MG tablet TAKE 1 TABLET BY MOUTH EVERY EVENING 90 tablet 3   Zinc 50 MG CAPS Take 1 capsule by mouth daily.      Allergies Allergies  Allergen Reactions   Codeine Other (See Comments) and Anaphylaxis    Hallucinations    Family History Family History  Problem Relation Age of Onset   Stroke Father    Coronary artery disease Father    Asthma Mother    Coronary artery disease Maternal Grandfather    COPD Paternal Grandfather     Review of Systems: Constitutional: no fevers or chills Eye:  no recent significant change in vision Ear/Nose/Mouth/Throat:  Ears:  no hearing loss Nose/Mouth/Throat:  no complaints of nasal congestion, no sore throat Cardiovascular:  no chest pain Respiratory:  no shortness  of breath Gastrointestinal:  no abdominal pain, no change in bowel habits GU:  Male: negative for dysuria, frequency, and incontinence Musculoskeletal/Extremities:  no pain of the joints Integumentary (Skin/Breast):  no abnormal skin lesions reported Neurologic:  no headaches Endocrine: No unexpected weight changes Hematologic/Lymphatic:  no night sweats  Exam BP 138/80 (BP Location: Left Arm, Patient Position: Sitting, Cuff Size: Normal)   Pulse 62   Temp 98 F (36.7 C) (Oral)   Ht 5\' 10"  (1.778 m)   Wt 203 lb (92.1 kg)   SpO2 98%   BMI 29.13 kg/m  General:  well developed, well nourished, in no apparent distress Skin:  no significant moles, warts, or growths Head:  no masses, lesions, or tenderness Eyes:  pupils equal and round, sclera anicteric without injection Ears:  canals without lesions, TMs shiny without retraction, no obvious effusion, no erythema Nose:  nares patent, mucosa normal Throat/Pharynx:  lips and gingiva without lesion; tongue and uvula midline; non-inflamed pharynx; no exudates or postnasal drainage Neck: neck supple without adenopathy, thyromegaly, or masses Lungs:  clear to auscultation, breath sounds equal bilaterally, no respiratory distress Cardio:  regular rate and rhythm, no bruits, no LE edema Abdomen:  abdomen soft, nontender; bowel sounds normal; no masses or organomegaly Rectal: Deferred Musculoskeletal:  symmetrical muscle groups noted without atrophy or deformity Extremities:  no clubbing, cyanosis,  or edema, no deformities, no skin discoloration Neuro:  gait normal; deep tendon reflexes normal and symmetric Psych: well oriented with normal range of affect and appropriate judgment/insight  Assessment and Plan  Well adult exam - Plan: CBC, Comprehensive metabolic panel, Lipid panel  Diabetes mellitus type 2 in obese (HCC) - Plan: Hemoglobin A1c, Microalbumin / creatinine urine ratio, atorvastatin (LIPITOR) 10 MG tablet  Screening for  prostate cancer - Plan: PSA  Tachycardia - Plan: atenolol (TENORMIN) 50 MG tablet  Insomnia, unspecified type - Plan: traZODone (DESYREL) 50 MG tablet  Gastroesophageal reflux disease - Plan: pantoprazole (PROTONIX) 40 MG tablet   Well 51 y.o. male. Counseled on diet and exercise. Counseled on risks and benefits of prostate cancer screening with PSA. The patient agrees to undergo screening.  Advanced directive form requested today.  Other orders as above. Follow up in 6 mo pending the above workup. The patient voiced understanding and agreement to the plan.  Jilda Roche [place], DO 02/11/22 7:35 AM

## 2022-02-18 ENCOUNTER — Encounter: Payer: 59 | Admitting: Family Medicine

## 2022-03-05 ENCOUNTER — Other Ambulatory Visit (HOSPITAL_BASED_OUTPATIENT_CLINIC_OR_DEPARTMENT_OTHER): Payer: Self-pay

## 2022-03-30 ENCOUNTER — Other Ambulatory Visit (HOSPITAL_BASED_OUTPATIENT_CLINIC_OR_DEPARTMENT_OTHER): Payer: Self-pay

## 2022-03-30 ENCOUNTER — Other Ambulatory Visit: Payer: Self-pay

## 2022-03-30 ENCOUNTER — Other Ambulatory Visit: Payer: Self-pay | Admitting: Family Medicine

## 2022-03-30 DIAGNOSIS — J310 Chronic rhinitis: Secondary | ICD-10-CM

## 2022-03-30 MED ORDER — LEVOCETIRIZINE DIHYDROCHLORIDE 5 MG PO TABS
ORAL_TABLET | Freq: Every evening | ORAL | 3 refills | Status: AC
  Filled 2022-03-30: qty 90, 90d supply, fill #0
  Filled 2022-06-27: qty 30, 30d supply, fill #1
  Filled 2022-08-04: qty 30, 30d supply, fill #2
  Filled 2022-08-22: qty 30, 30d supply, fill #3

## 2022-04-14 ENCOUNTER — Other Ambulatory Visit (HOSPITAL_BASED_OUTPATIENT_CLINIC_OR_DEPARTMENT_OTHER): Payer: Self-pay

## 2022-04-14 ENCOUNTER — Other Ambulatory Visit: Payer: Self-pay | Admitting: Family Medicine

## 2022-04-14 MED ORDER — FLUTICASONE-SALMETEROL 250-50 MCG/ACT IN AEPB
1.0000 | INHALATION_SPRAY | Freq: Two times a day (BID) | RESPIRATORY_TRACT | 5 refills | Status: DC
  Filled 2022-04-14: qty 60, 30d supply, fill #0
  Filled 2022-05-19: qty 60, 30d supply, fill #1
  Filled 2022-06-27: qty 60, 30d supply, fill #2

## 2022-05-16 ENCOUNTER — Other Ambulatory Visit (HOSPITAL_BASED_OUTPATIENT_CLINIC_OR_DEPARTMENT_OTHER): Payer: Self-pay

## 2022-05-19 ENCOUNTER — Other Ambulatory Visit (HOSPITAL_BASED_OUTPATIENT_CLINIC_OR_DEPARTMENT_OTHER): Payer: Self-pay

## 2022-06-08 ENCOUNTER — Other Ambulatory Visit (HOSPITAL_BASED_OUTPATIENT_CLINIC_OR_DEPARTMENT_OTHER): Payer: Self-pay

## 2022-06-12 ENCOUNTER — Other Ambulatory Visit (HOSPITAL_BASED_OUTPATIENT_CLINIC_OR_DEPARTMENT_OTHER): Payer: Self-pay

## 2022-06-14 ENCOUNTER — Other Ambulatory Visit (HOSPITAL_BASED_OUTPATIENT_CLINIC_OR_DEPARTMENT_OTHER): Payer: Self-pay

## 2022-06-27 ENCOUNTER — Encounter: Payer: Self-pay | Admitting: Family Medicine

## 2022-06-27 ENCOUNTER — Other Ambulatory Visit (HOSPITAL_BASED_OUTPATIENT_CLINIC_OR_DEPARTMENT_OTHER): Payer: Self-pay

## 2022-06-28 ENCOUNTER — Other Ambulatory Visit (HOSPITAL_BASED_OUTPATIENT_CLINIC_OR_DEPARTMENT_OTHER): Payer: Self-pay

## 2022-06-28 ENCOUNTER — Other Ambulatory Visit: Payer: Self-pay

## 2022-06-28 ENCOUNTER — Other Ambulatory Visit: Payer: Self-pay | Admitting: Family Medicine

## 2022-06-28 MED ORDER — QVAR REDIHALER 40 MCG/ACT IN AERB
2.0000 | INHALATION_SPRAY | Freq: Two times a day (BID) | RESPIRATORY_TRACT | 5 refills | Status: AC
  Filled 2022-06-28: qty 10.6, 30d supply, fill #0
  Filled 2022-07-22 (×2): qty 10.6, 30d supply, fill #1
  Filled 2022-08-22: qty 10.6, 30d supply, fill #2

## 2022-07-22 ENCOUNTER — Other Ambulatory Visit (HOSPITAL_BASED_OUTPATIENT_CLINIC_OR_DEPARTMENT_OTHER): Payer: Self-pay

## 2022-07-28 ENCOUNTER — Other Ambulatory Visit (HOSPITAL_BASED_OUTPATIENT_CLINIC_OR_DEPARTMENT_OTHER): Payer: Self-pay

## 2022-08-22 ENCOUNTER — Other Ambulatory Visit (HOSPITAL_COMMUNITY): Payer: Self-pay

## 2022-08-22 ENCOUNTER — Other Ambulatory Visit (HOSPITAL_BASED_OUTPATIENT_CLINIC_OR_DEPARTMENT_OTHER): Payer: Self-pay

## 2022-10-09 ENCOUNTER — Other Ambulatory Visit (HOSPITAL_BASED_OUTPATIENT_CLINIC_OR_DEPARTMENT_OTHER): Payer: Self-pay

## 2023-01-22 IMAGING — CT CT CARDIAC CORONARY ARTERY CALCIUM SCORE
3 series · 14 of 20 positions shown, 16 images · non-contrast
Comparison: 1152 chest radiograph. 08/18/2014 CTA chest from
[REDACTED].
COMPARISON: 1152 chest radiograph. 08/18/2014 CTA chest from
[REDACTED].

Addendum:
EXAM:
OVER-READ INTERPRETATION  CT CHEST

The following report is an over-read performed by radiologist Dr.
Blain Jumper [REDACTED] on 11/18/2020. This over-read
does not include interpretation of cardiac or coronary anatomy or
pathology. The calcium score interpretation by the cardiologist is
attached.
CLINICAL DATA: Cardiovascular Disease Risk stratification
Coronary Calcium Score
TECHNIQUE: A gated, non-contrast computed tomography scan of the heart was
performed using 3mm slice thickness. Axial images were analyzed on a
dedicated workstation. Calcium scoring of the coronary arteries was
performed using the Agatston method.

[Series 2: ax lung · axial · 0.89mm/px · z∈[+1048,+1138]mm · 5 of 69 slices shown]
[im 12/69  lung]
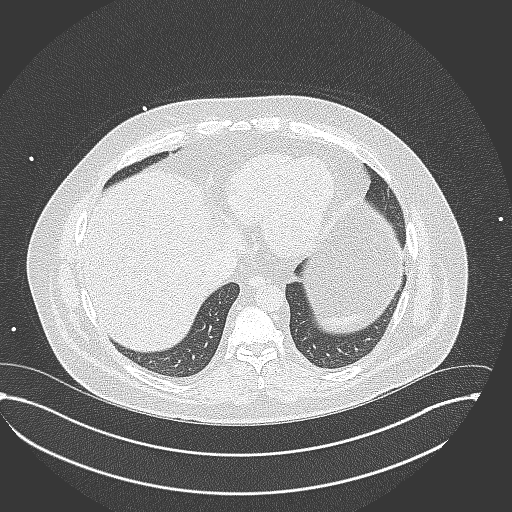
[im 23/69  lung]
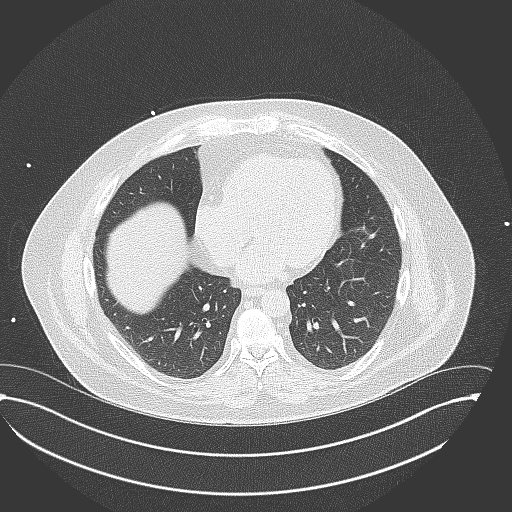
[im 35/69  lung]
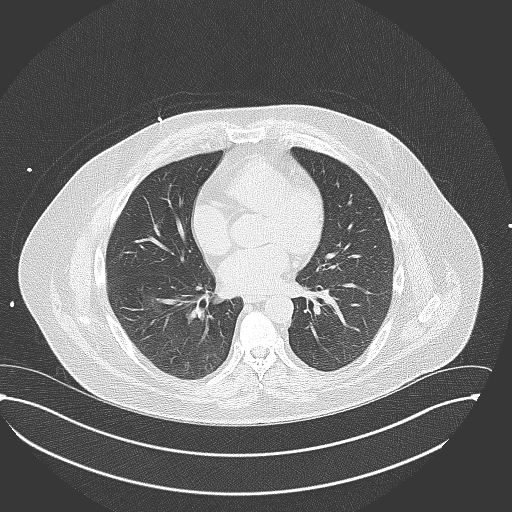
[im 46/69  lung]
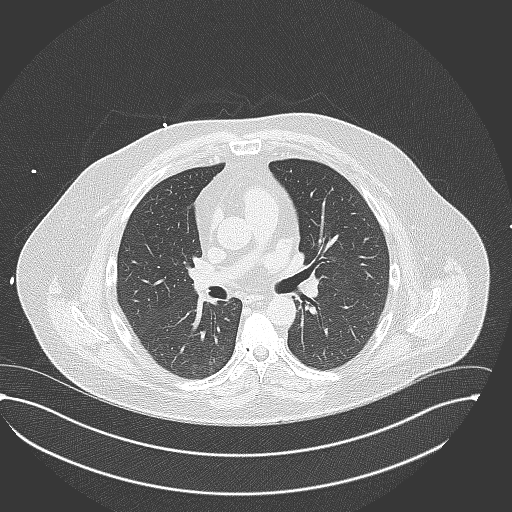
[im 57/69  lung]
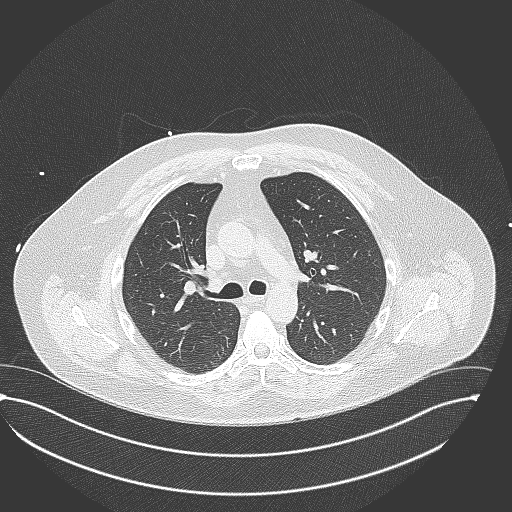

[Series 3: cascseq 3.0 sa36 70% (id) · axial · 0.39mm/px · z∈[+1060,+1126]mm · 3 of 46 slices shown]
[im 12/46  vessel]
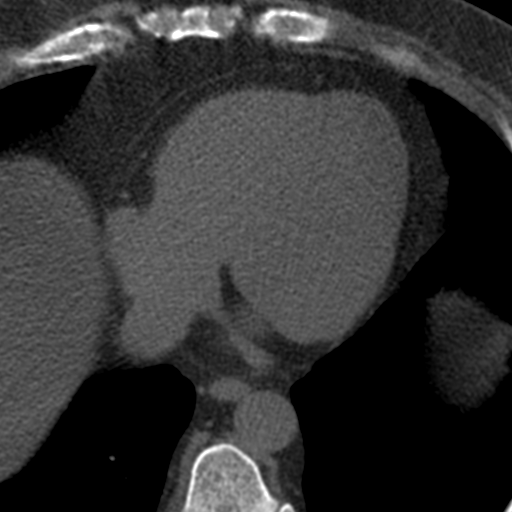
[im 23/46  vessel]
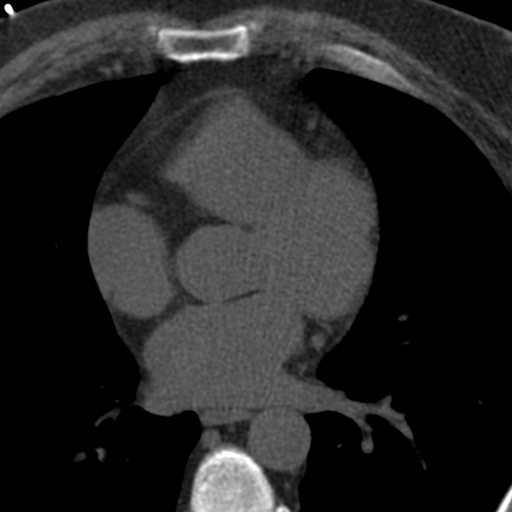
[im 34/46  vessel]
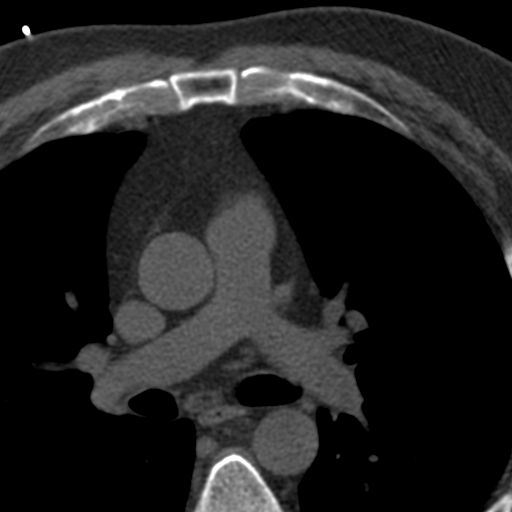

[Series 4: ax st · axial · 0.89mm/px · z∈[+1044,+1142]mm · 6 of 69 slices shown, 8 images]
[im 10/69  vessel]
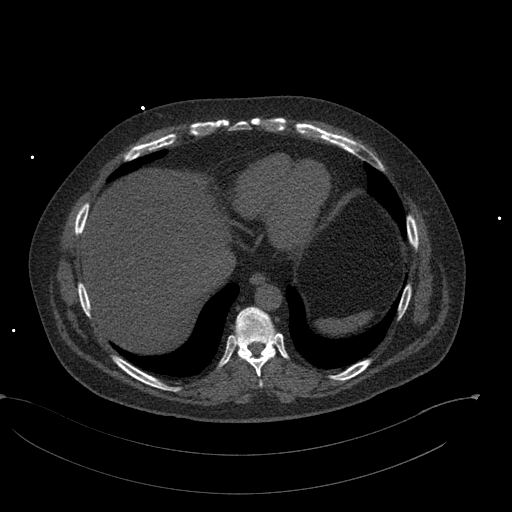
[im 10/69  lung]
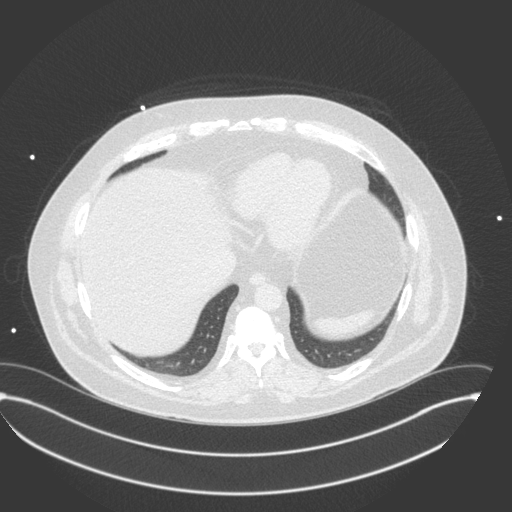
[im 20/69  vessel]
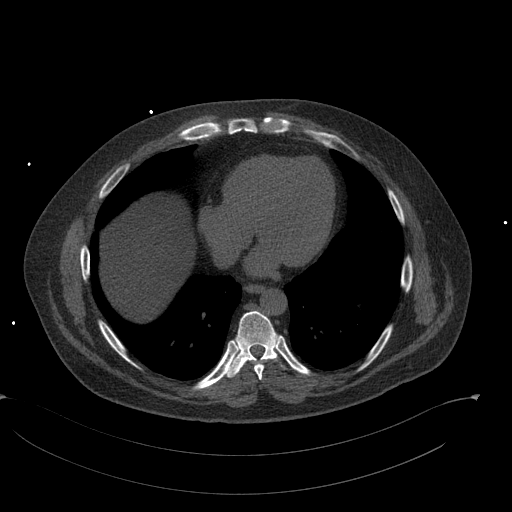
[im 30/69  vessel]
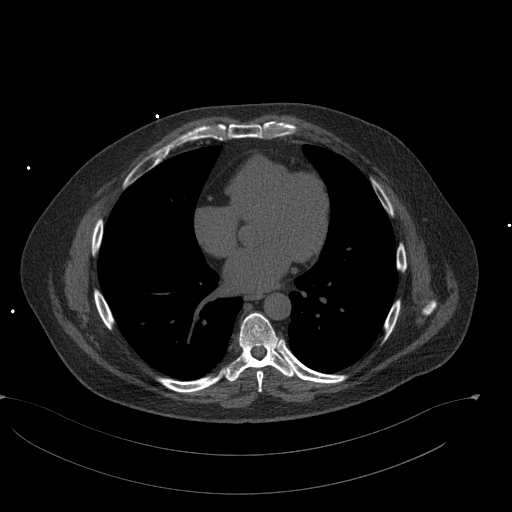
[im 39/69  vessel]
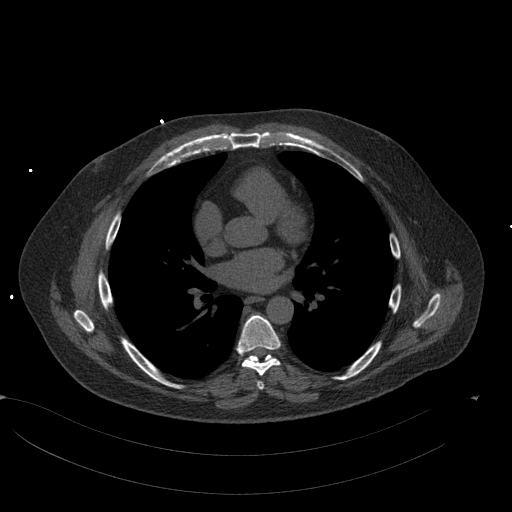
[im 49/69  vessel]
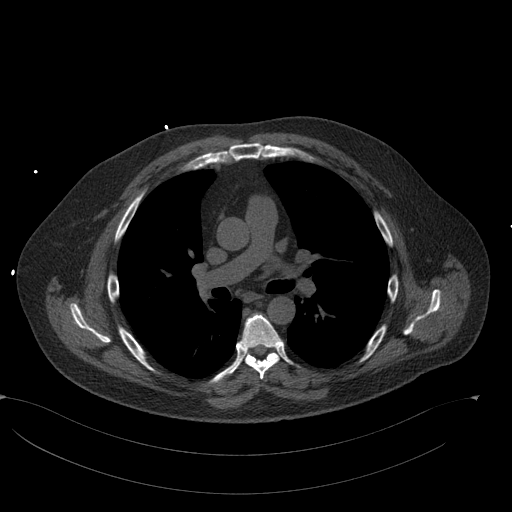
[im 49/69  lung]
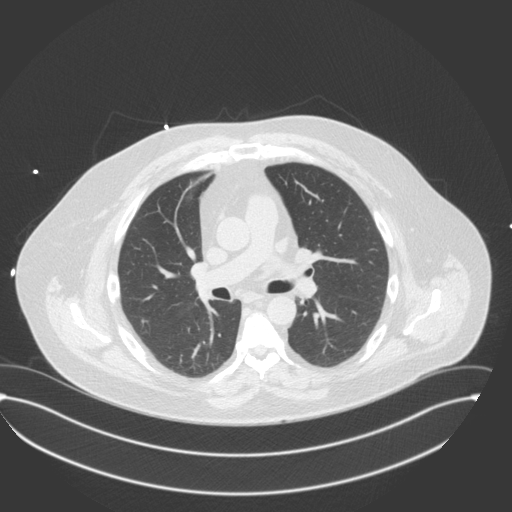
[im 59/69  vessel]
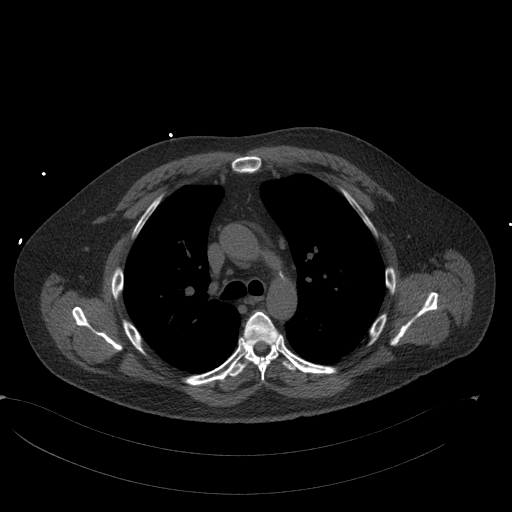

[14 of 20 positions shown; findings below may reference images not displayed]

FINDINGS: Vascular: Aortic atherosclerosis.

Mediastinum/Nodes: No imaged thoracic adenopathy.

Lungs/Pleura: No pleural fluid. Calcified right lower lobe
granuloma.

Upper Abdomen: Moderate hepatic steatosis. Normal imaged portions of
the spleen, stomach.

Musculoskeletal: No acute osseous abnormality.
IMPRESSION: 1.  No acute findings in the imaged extracardiac chest.
2. Hepatic steatosis.
3.  Aortic Atherosclerosis (O5F6A-[facility]).
FINDINGS: Coronary arteries: Normal origins.

Coronary Calcium Score:

Left main: 0

Left anterior descending artery: 0

Left circumflex artery: 0

Right coronary artery: 0

Total: 0

Pericardium: Normal.

Ascending Aorta: Normal caliber.

Non-cardiac: See separate report from [REDACTED].
IMPRESSION: Coronary calcium score of 0 Agatston units. This was low risk for
future cardiac events.



If CAC=0, it is reasonable to withhold statin therapy and reassess
in 5 to 10 years, as long as higher risk conditions are absent
(diabetes mellitus, family history of premature CHD in first degree
relatives (males <55 years; females <65 years), cigarette smoking,
or LDL >=190 mg/dL).

If CAC is 1 to 99, it is reasonable to initiate statin therapy for
patients >=55 years of age.

If CAC is >=100 or >=75th percentile, it is reasonable to initiate
statin therapy at any age.

Cardiology referral should be considered for patients with CAC
scores >=400 or >=75th percentile.

*8347 AHA/ACC/AACVPR/AAPA/ABC/Everhart/ROHAN/BULAC/CLASS/Julaina/SILVER/ASTRID KARIN
Guideline on the Management of Blood Cholesterol: A Report of the
American College of Cardiology/American Heart Association Task Force
on Clinical Practice Guidelines. J Am Coll Cardiol.
5820;73(24):2337-2774.

*** End of Addendum ***
EXAM:
OVER-READ INTERPRETATION  CT CHEST

The following report is an over-read performed by radiologist Dr.
MARTIRO [REDACTED] on 11/18/2020. This over-read
does not include interpretation of cardiac or coronary anatomy or
pathology. The calcium score interpretation by the cardiologist is
attached.
FINDINGS: Vascular: Aortic atherosclerosis.

Mediastinum/Nodes: No imaged thoracic adenopathy.

Lungs/Pleura: No pleural fluid. Calcified right lower lobe
granuloma.

Upper Abdomen: Moderate hepatic steatosis. Normal imaged portions of
the spleen, stomach.

Musculoskeletal: No acute osseous abnormality.
IMPRESSION: 1.  No acute findings in the imaged extracardiac chest.
2. Hepatic steatosis.
3.  Aortic Atherosclerosis (O5F6A-[facility]).

## 2023-01-22 IMAGING — US US ABDOMEN LIMITED
1 series · 14 of 25 positions shown · non-contrast
Comparison: Noncontrast CT Abdomen and Pelvis 02/24/2015.

CLINICAL DATA: 49-year-old male with abnormal LFTs since 0376.

EXAM:
ULTRASOUND ABDOMEN LIMITED RIGHT UPPER QUADRANT

[Series 1: us abdomen limited ruq (liver/gb) · 14 of 39 slices shown]
[im 1/39]
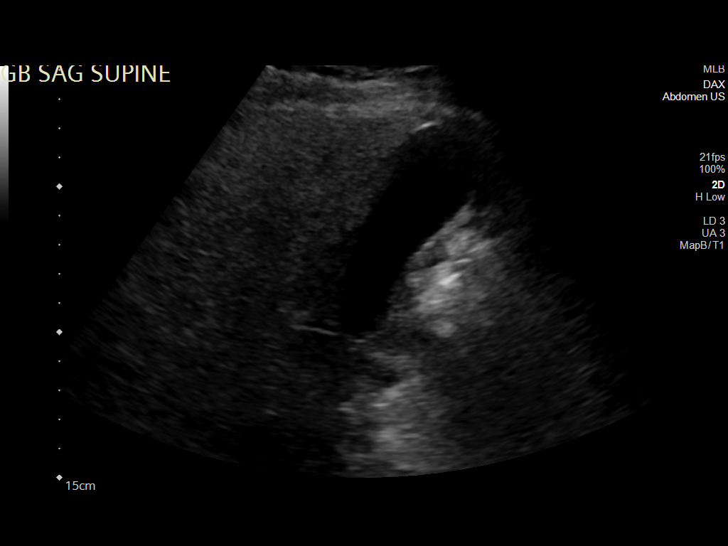
[im 4/39]
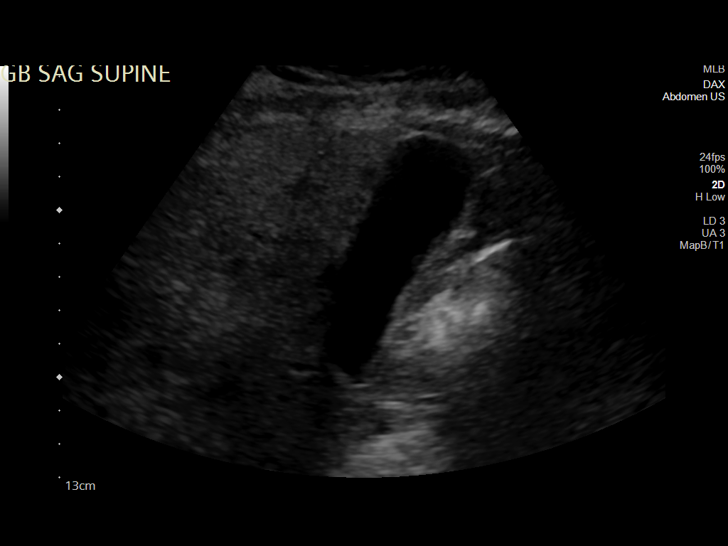
[im 7/39]
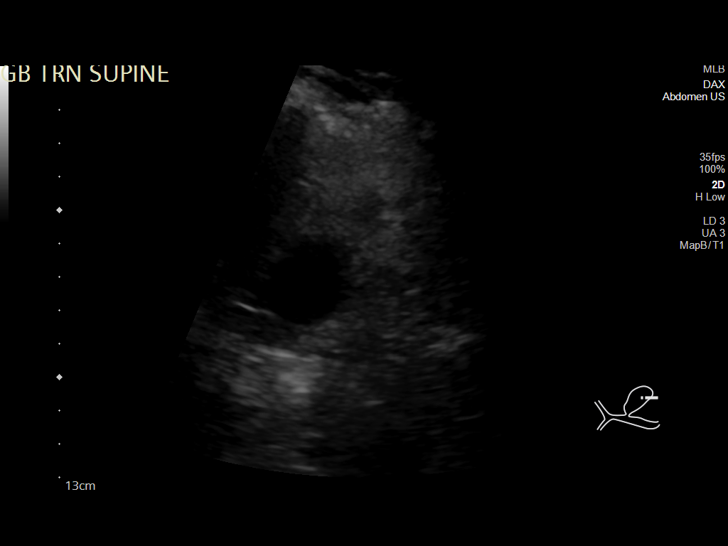
[im 10/39]
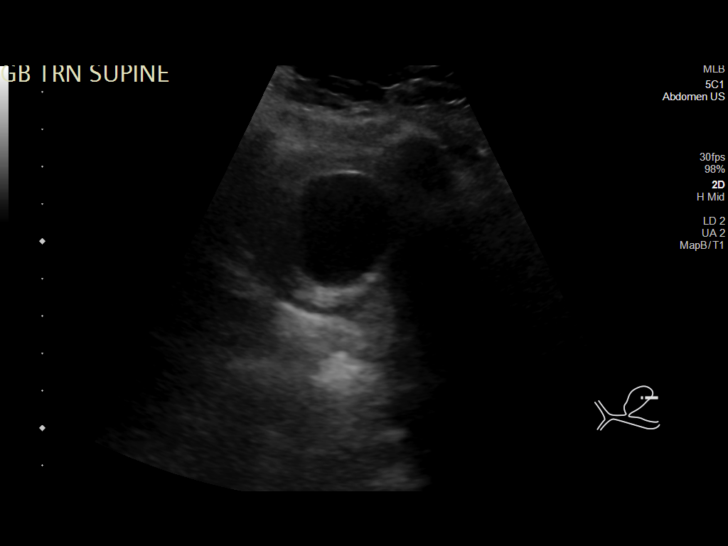
[im 13/39]
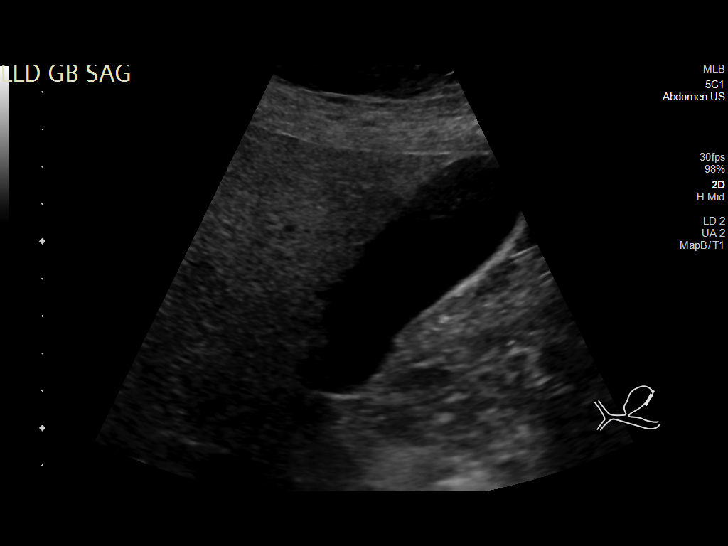
[im 15/39]
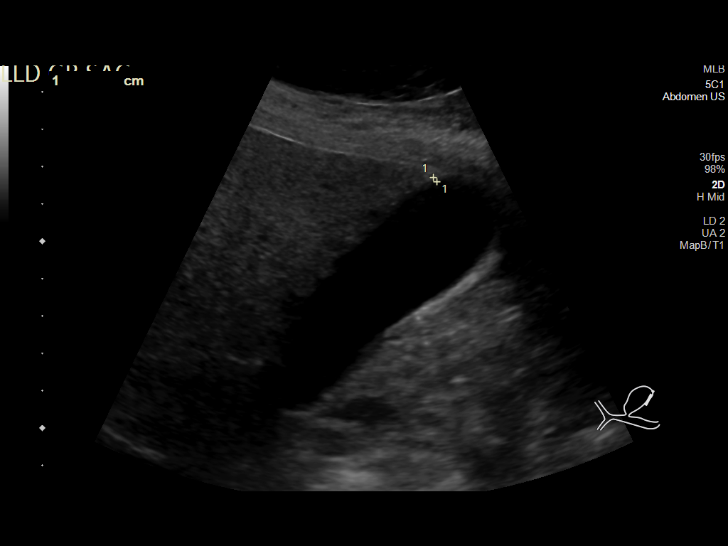
[im 18/39]
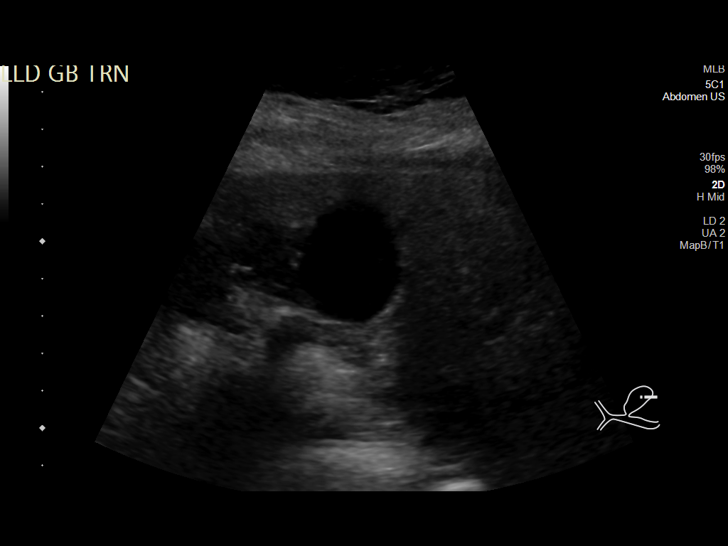
[im 21/39]
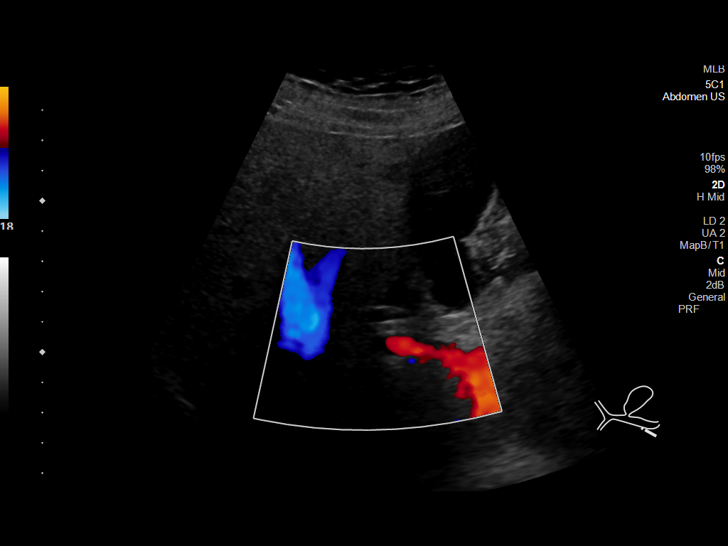
[im 24/39]
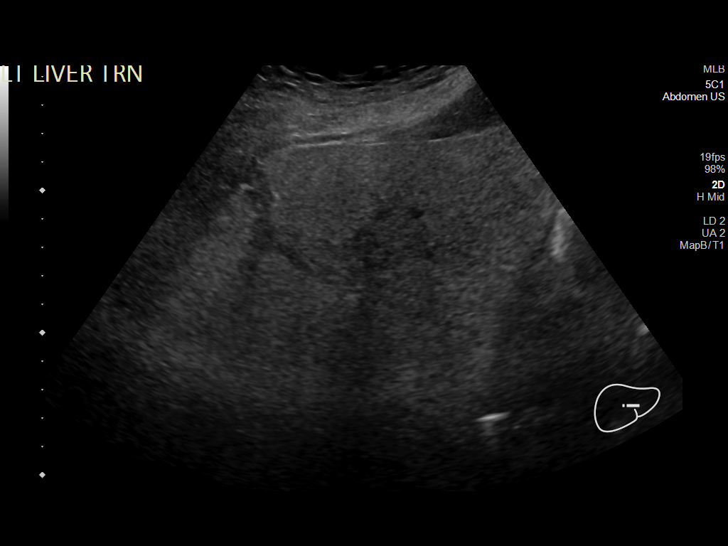
[im 26/39]
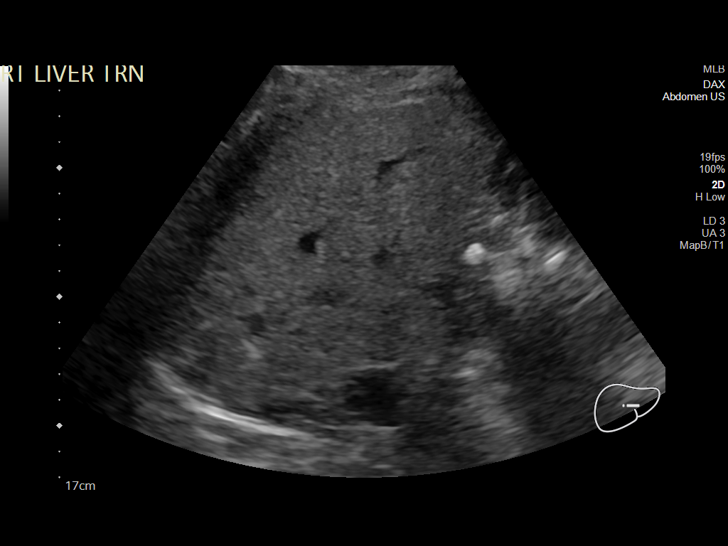
[im 29/39]
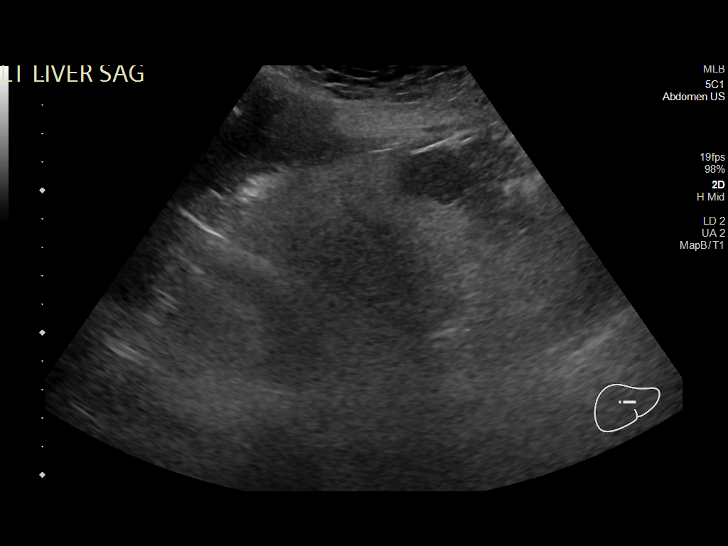
[im 32/39]
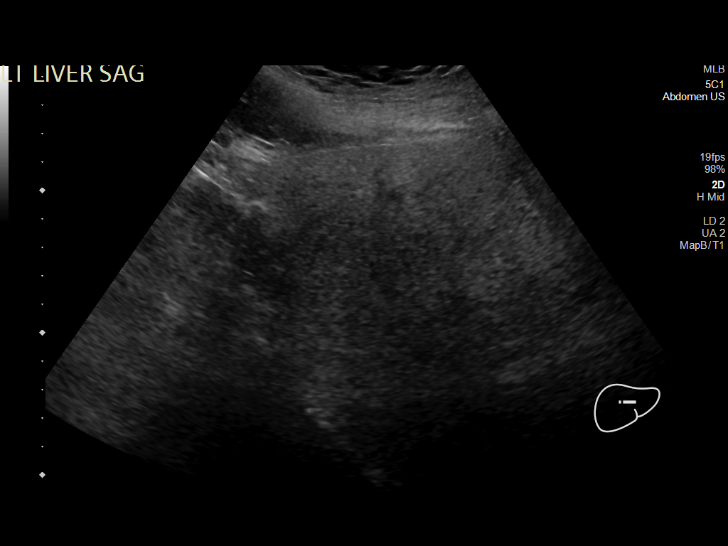
[im 35/39]
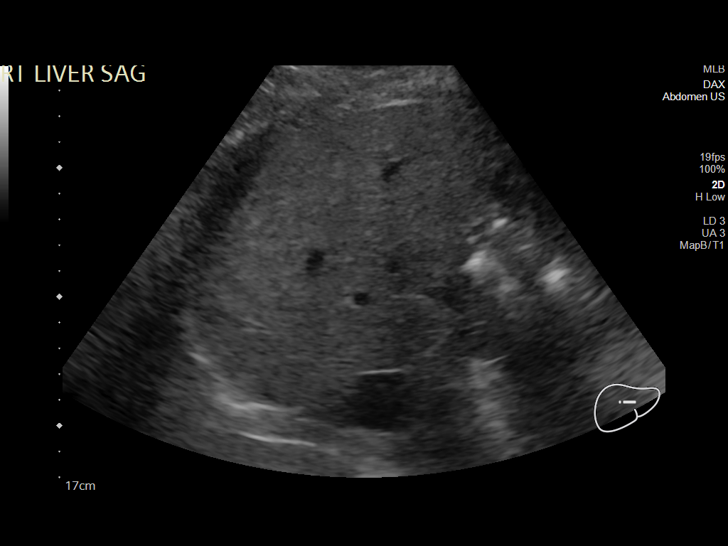
[im 39/39]
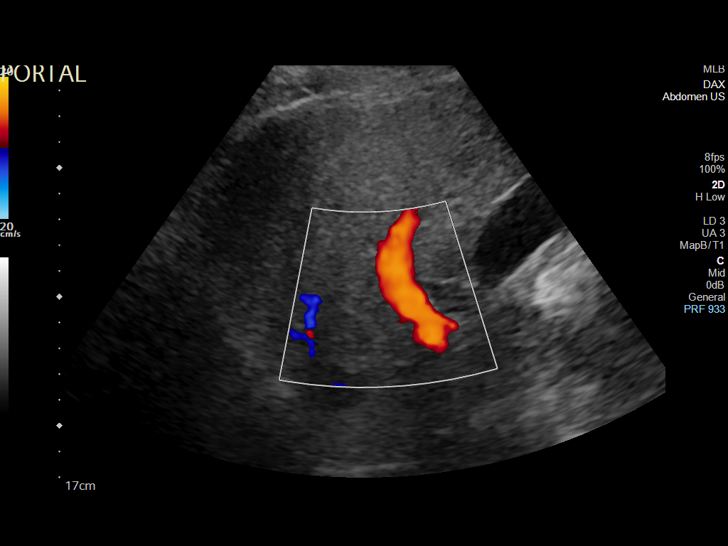

[14 of 25 positions shown; findings below may reference images not displayed]

FINDINGS: Gallbladder:

No gallstones or wall thickening visualized. No sonographic Murphy
sign noted by sonographer.

Common bile duct:

Diameter: 4 mm, normal.

Liver:

Echogenic liver (image 39) with chronically heterogeneous appearance
of the liver parenchyma by both CT and on today's ultrasound. Coarse
echotexture today, but no discrete liver lesion. No intrahepatic
biliary ductal dilatation. Portal vein is patent on color Doppler
imaging with normal direction of blood flow towards the liver.

Other: Negative visible right kidney.
IMPRESSION: 1. Chronic fatty liver disease.
2. Otherwise negative right upper quadrant ultrasound.

## 2023-12-21 ENCOUNTER — Emergency Department (HOSPITAL_COMMUNITY)
Admission: EM | Admit: 2023-12-21 | Discharge: 2023-12-22 | Disposition: A | Discharge status: Home / Self Care | Payer: Self-pay | Attending: Emergency Medicine | Admitting: Emergency Medicine

## 2023-12-21 ENCOUNTER — Encounter (HOSPITAL_COMMUNITY): Payer: Self-pay

## 2023-12-21 ENCOUNTER — Other Ambulatory Visit: Payer: Self-pay

## 2023-12-21 DIAGNOSIS — Z79899 Other long term (current) drug therapy: Secondary | ICD-10-CM | POA: Diagnosis not present

## 2023-12-21 DIAGNOSIS — I10 Essential (primary) hypertension: Secondary | ICD-10-CM | POA: Diagnosis not present

## 2023-12-21 DIAGNOSIS — R1013 Epigastric pain: Secondary | ICD-10-CM | POA: Insufficient documentation

## 2023-12-21 DIAGNOSIS — R079 Chest pain, unspecified: Secondary | ICD-10-CM | POA: Insufficient documentation

## 2023-12-21 DIAGNOSIS — R748 Abnormal levels of other serum enzymes: Secondary | ICD-10-CM | POA: Diagnosis not present

## 2023-12-21 DIAGNOSIS — E119 Type 2 diabetes mellitus without complications: Secondary | ICD-10-CM | POA: Insufficient documentation

## 2023-12-21 HISTORY — DX: Gastro-esophageal reflux disease without esophagitis: K21.9

## 2023-12-21 NOTE — ED Triage Notes (Signed)
 Pov from home with wife. Was getting ready for bed when he sudden had center chest pain that goes to the right side. C/o chills and belching.  7/10 pressure

## 2023-12-21 NOTE — ED Notes (Addendum)
 This RN completed a cardiac assessment on the pt.  The pt endorses having chills and chest pain with pressure,(like someone is sitting on his chest) that is 7/10 substernal area that radiates to his back that is constant. Nothing makes it better or worse. Pt endorses taking tums with no relief. Pt endorses PMH of tachycardia,  he took his medication for it like normal today. Pt enodrses that he takes Mounjaro for weight loss and his diabetes.

## 2023-12-22 ENCOUNTER — Emergency Department (HOSPITAL_COMMUNITY)

## 2023-12-22 LAB — CBC WITH DIFFERENTIAL/PLATELET
Abs Immature Granulocytes: 0.07 K/uL (ref 0.00–0.07)
Basophils Absolute: 0.1 K/uL (ref 0.0–0.1)
Basophils Relative: 1 %
Eosinophils Absolute: 0.6 K/uL — ABNORMAL HIGH (ref 0.0–0.5)
Eosinophils Relative: 5 %
HCT: 43.6 % (ref 39.0–52.0)
Hemoglobin: 14.3 g/dL (ref 13.0–17.0)
Immature Granulocytes: 1 %
Lymphocytes Relative: 16 %
Lymphs Abs: 1.8 K/uL (ref 0.7–4.0)
MCH: 27.9 pg (ref 26.0–34.0)
MCHC: 32.8 g/dL (ref 30.0–36.0)
MCV: 85 fL (ref 80.0–100.0)
Monocytes Absolute: 0.9 K/uL (ref 0.1–1.0)
Monocytes Relative: 8 %
Neutro Abs: 7.6 K/uL (ref 1.7–7.7)
Neutrophils Relative %: 69 %
Platelets: 251 K/uL (ref 150–400)
RBC: 5.13 MIL/uL (ref 4.22–5.81)
RDW: 12.8 % (ref 11.5–15.5)
WBC: 10.9 K/uL — ABNORMAL HIGH (ref 4.0–10.5)
nRBC: 0 % (ref 0.0–0.2)

## 2023-12-22 LAB — LIPASE, BLOOD: Lipase: 62 U/L — ABNORMAL HIGH (ref 11–51)

## 2023-12-22 LAB — COMPREHENSIVE METABOLIC PANEL WITH GFR
ALT: 24 U/L (ref 0–44)
AST: 37 U/L (ref 15–41)
Albumin: 4.8 g/dL (ref 3.5–5.0)
Alkaline Phosphatase: 55 U/L (ref 38–126)
Anion gap: 11 (ref 5–15)
BUN: 15 mg/dL (ref 6–20)
CO2: 27 mmol/L (ref 22–32)
Calcium: 9.8 mg/dL (ref 8.9–10.3)
Chloride: 103 mmol/L (ref 98–111)
Creatinine, Ser: 0.84 mg/dL (ref 0.61–1.24)
GFR, Estimated: >60 mL/min (ref >60)
Glucose, Bld: 104 mg/dL — ABNORMAL HIGH (ref 70–99)
Potassium: 3.9 mmol/L (ref 3.5–5.1)
Sodium: 140 mmol/L (ref 135–145)
Total Bilirubin: 0.4 mg/dL (ref 0.0–1.2)
Total Protein: 7.6 g/dL (ref 6.5–8.1)

## 2023-12-22 LAB — TROPONIN T, HIGH SENSITIVITY
Troponin T High Sensitivity: <15 ng/L (ref 0–19)
Troponin T High Sensitivity: <15 ng/L (ref 0–19)

## 2023-12-22 MED ORDER — LIDOCAINE VISCOUS HCL 2 % MT SOLN
15.0000 mL | Freq: Once | OROMUCOSAL | Status: AC
  Administered 2023-12-22: 15 mL via ORAL
  Filled 2023-12-22: qty 15

## 2023-12-22 MED ORDER — SODIUM CHLORIDE 0.9 % IV BOLUS
1000.0000 mL | Freq: Once | INTRAVENOUS | Status: AC
  Administered 2023-12-22: 1000 mL via INTRAVENOUS

## 2023-12-22 MED ORDER — IOHEXOL 300 MG/ML  SOLN
100.0000 mL | Freq: Once | INTRAMUSCULAR | Status: AC | PRN
  Administered 2023-12-22: 100 mL via INTRAVENOUS

## 2023-12-22 MED ORDER — ALUM & MAG HYDROXIDE-SIMETH 200-200-20 MG/5ML PO SUSP
30.0000 mL | Freq: Once | ORAL | Status: AC
  Administered 2023-12-22: 30 mL via ORAL
  Filled 2023-12-22: qty 30

## 2023-12-22 NOTE — ED Notes (Signed)
 MD Delo made aware of the pt headache.

## 2023-12-22 NOTE — ED Provider Notes (Signed)
 [place] EMERGENCY DEPARTMENT AT [facility] Provider Note   CSN: [id number] Arrival date & time: 12/21/23  2327     Patient presents with: Chest Pain   Gregory Gonzalez Sally is a 53 y.o. male.   Patient is a 53 year old male with history of type 2 diabetes, hypertension, hyperlipidemia, idiopathic tachycardia.  Patient presenting today with complaints of chest/epigastric discomfort.  Symptoms began this evening while he was getting ready for bed.  He describes a pressure to his lower chest/epigastric region that began in the absence of any strenuous activity or injury.  The discomfort has been constant and unrelieved with Tums.  He denies any shortness of breath, but does feel somewhat nauseated.  No diaphoresis or radiation to the arm or jaw.  He denies any recent exertional symptoms.  Patient has no prior cardiac history and had a coronary CT in 2022 showing a calcium  scoring of 0.       Prior to Admission medications   Medication Sig Start Date End Date Taking? Authorizing Provider  albuterol  (VENTOLIN  HFA) 108 (90 Base) MCG/ACT inhaler Inhale 2 puffs into the lungs every 6 (six) hours as needed for wheezing or shortness of breath. 02/11/22   Frann Mabel Mt, DO  Ascorbic Acid (VITAMIN C) 1000 MG tablet Take 1,000 mg by mouth daily.    [provider]  atenolol  (TENORMIN ) 50 MG tablet Take 1 tablet (50 mg total) by mouth daily. 02/11/22   Frann Mabel Mt, DO  atorvastatin  (LIPITOR) 10 MG tablet Take 1 tablet (10 mg total) by mouth daily. 02/11/22   Frann Mabel Mt, DO  beclomethasone (QVAR  REDIHALER) 40 MCG/ACT inhaler Inhale 2 puffs into the lungs 2 (two) times daily. 06/28/22   Frann Mabel Mt, DO  Cholecalciferol 125 MCG (5000 UT) capsule Take 5,000 Units by mouth daily.    [provider]  famotidine (PEPCID) 10 MG tablet Take 10 mg by mouth 2 (two) times daily.    [provider]  fluticasone  (FLONASE ) 50 MCG/ACT  nasal spray PLACE 2 SPRAYS INTO BOTH NOSTRILS DAILY 09/13/21 09/13/22  Frann Mabel Mt, DO  levocetirizine (XYZAL ) 5 MG tablet TAKE 1 TABLET BY MOUTH EVERY EVENING 03/30/22 03/30/23  Frann Mabel Mt, DO  pantoprazole  (PROTONIX ) 40 MG tablet Take 1 tablet (40 mg total) by mouth daily. 02/11/22   Frann Mabel Mt, DO  traZODone  (DESYREL ) 50 MG tablet Take 1 tablet (50 mg total) by mouth at bedtime. 02/11/22   Frann Mabel Mt, DO  Zinc 50 MG CAPS Take 1 capsule by mouth daily.    [provider]    Allergies: Codeine    Review of Systems  All other systems reviewed and are negative.   Updated Vital Signs BP (!) 139/99 (BP Location: Left Arm)   Pulse 76   Temp 98.2 F (36.8 C)   Resp 20   Ht 5' 10 (1.778 m)   Wt 74.8 kg   SpO2 99%   BMI 23.68 kg/m   Physical Exam Vitals and nursing note reviewed.  Constitutional:      General: He is not in acute distress.    Appearance: He is well-developed. He is not diaphoretic.  HENT:     Head: Normocephalic and atraumatic.  Cardiovascular:     Rate and Rhythm: Normal rate and regular rhythm.     Heart sounds: No murmur heard.    No friction rub.  Pulmonary:     Effort: Pulmonary effort is normal. No respiratory distress.  Breath sounds: Normal breath sounds. No wheezing or rales.  Abdominal:     General: Bowel sounds are normal. There is no distension.     Palpations: Abdomen is soft.     Tenderness: There is no abdominal tenderness.  Musculoskeletal:        General: Normal range of motion.     Cervical back: Normal range of motion and neck supple.     Right lower leg: No tenderness. No edema.     Left lower leg: No tenderness. No edema.  Skin:    General: Skin is warm and dry.  Neurological:     Mental Status: He is alert and oriented to person, place, and time.     Coordination: Coordination normal.     (all labs ordered are listed, but only abnormal results are displayed) Labs Reviewed   COMPREHENSIVE METABOLIC PANEL WITH GFR  CBC WITH DIFFERENTIAL/PLATELET  LIPASE, BLOOD  TROPONIN T, HIGH SENSITIVITY    EKG: EKG Interpretation Date/Time:  Thursday December 21 2023 23:49:11 EDT Ventricular Rate:  74 PR Interval:  73 QRS Duration:  81 QT Interval:  356 QTC Calculation: 387 R Axis:   2  Text Interpretation: Sinus rhythm Short PR interval Consider right atrial enlargement Abnormal R-wave progression, early transition Artifact in lead(s) I II III aVR aVL aVF and baseline wander in lead(s) V3 Confirmed by Geroldine Berg ([id number]) on 12/21/2023 11:51:06 PM  Radiology: No results found.   Procedures   Medications Ordered in the ED  alum & mag hydroxide-simeth (MAALOX/MYLANTA) 200-200-20 MG/5ML suspension 30 mL (has no administration in time range)    And  lidocaine (XYLOCAINE) 2 % viscous mouth solution 15 mL (has no administration in time range)                                    Medical Decision Making Amount and/or Complexity of Data Reviewed Labs: ordered. Radiology: ordered.  Risk OTC drugs. Prescription drug management.   Patient is a 53 year old male presenting with complaints of epigastric pain starting just prior to arrival.  Patient arrives here with stable vital signs and is afebrile.  Physical examination reveals mild tenderness in the epigastric region, but no peritoneal signs.  Laboratory studies obtained including CBC, CMP, and troponin x 2, all of which are basically unremarkable.  Lipase is mildly elevated at 62.  Chest x-ray obtained showing no acute process.  CT scan of the chest, abdomen, and pelvis also obtained showing no acute pathology such as dissection or bowel obstruction.  Patient given a GI cocktail with no significant improvement.  After he had been observed for several hours, his symptoms have slowly resolved.  Because of his discomfort unclear, but could be related to pancreatitis most likely from taking Mounjaro.  Patient has  declined any pain medication and feels well enough to go home.  I will advise him to adhere to a clear liquid diet for the next 1 to 2 days and return to the ER if symptoms worsen or change.     Final diagnoses:  None    ED Discharge Orders     None          Geroldine Berg, MD 12/22/23 [phone]

## 2023-12-22 NOTE — ED Notes (Addendum)
 Pt is endorsing 4/10 Occipital headache and he says he does not get headaches and wanted to make us  aware. This RN asked the pt if he would like anything for the pain at this time and he endorsed no.

## 2023-12-22 NOTE — Discharge Instructions (Signed)
 Clear liquid diet for the next 1 to 2 days, then slowly advance diet as tolerated.  Return to the ER if you develop worsening pain, high fevers, difficulty breathing, or for other new and concerning symptoms.

## 2023-12-22 NOTE — ED Notes (Signed)
 This RN completed a reassessment on the pts pain sub sternum and he endorses that the pain is the same and nothing has changed. This RN gave the pt a heating pack to help decrease pain in head.

## 2023-12-22 NOTE — ED Notes (Signed)
 Pt in CT.

## 2023-12-22 NOTE — ED Notes (Signed)
 Pt is back from CT

## 2023-12-25 ENCOUNTER — Other Ambulatory Visit (HOSPITAL_BASED_OUTPATIENT_CLINIC_OR_DEPARTMENT_OTHER): Payer: Self-pay | Admitting: Internal Medicine

## 2023-12-25 DIAGNOSIS — R1013 Epigastric pain: Secondary | ICD-10-CM

## 2023-12-27 ENCOUNTER — Ambulatory Visit (INDEPENDENT_AMBULATORY_CARE_PROVIDER_SITE_OTHER)

## 2023-12-27 DIAGNOSIS — R1013 Epigastric pain: Secondary | ICD-10-CM

## 2024-02-01 ENCOUNTER — Other Ambulatory Visit (HOSPITAL_BASED_OUTPATIENT_CLINIC_OR_DEPARTMENT_OTHER): Payer: Self-pay

## 2024-02-01 MED ORDER — ONDANSETRON 4 MG PO TBDP
ORAL_TABLET | ORAL | 0 refills | Status: AC
  Filled 2024-02-01: qty 7, 3d supply, fill #0

## 2024-02-01 MED ORDER — TRAMADOL HCL 50 MG PO TABS
ORAL_TABLET | ORAL | 0 refills | Status: AC
  Filled 2024-02-01: qty 12, 5d supply, fill #0

## 2024-02-01 MED ORDER — DOCUSATE SODIUM 100 MG PO CAPS
ORAL_CAPSULE | ORAL | 2 refills | Status: AC
  Filled 2024-02-01: qty 10, 10d supply, fill #0

## 2024-02-16 ENCOUNTER — Other Ambulatory Visit (HOSPITAL_BASED_OUTPATIENT_CLINIC_OR_DEPARTMENT_OTHER): Payer: Self-pay

## 2024-02-16 MED ORDER — VALACYCLOVIR HCL 1 G PO TABS
1.0000 g | ORAL_TABLET | Freq: Three times a day (TID) | ORAL | 0 refills | Status: AC
  Filled 2024-02-16: qty 21, 7d supply, fill #0
